# Patient Record
Sex: Male | Born: 1970 | Race: Black or African American | Hispanic: No | Marital: Married | Smoking: Former smoker
Health system: Southern US, Community
[De-identification: ages and names within clinical notes are randomized; demographics above are authoritative.]

## PROBLEM LIST (undated history)

## (undated) DIAGNOSIS — R7303 Prediabetes: Secondary | ICD-10-CM

## (undated) DIAGNOSIS — E785 Hyperlipidemia, unspecified: Secondary | ICD-10-CM

## (undated) DIAGNOSIS — R011 Cardiac murmur, unspecified: Secondary | ICD-10-CM

## (undated) DIAGNOSIS — I10 Essential (primary) hypertension: Secondary | ICD-10-CM

## (undated) DIAGNOSIS — K219 Gastro-esophageal reflux disease without esophagitis: Secondary | ICD-10-CM

## (undated) HISTORY — DX: Hyperlipidemia, unspecified: E78.5

## (undated) HISTORY — DX: Prediabetes: R73.03

## (undated) HISTORY — DX: Cardiac murmur, unspecified: R01.1

## (undated) HISTORY — DX: Gastro-esophageal reflux disease without esophagitis: K21.9

## (undated) HISTORY — DX: Essential (primary) hypertension: I10

---

## 2019-05-25 LAB — HM HIV SCREENING LAB: HM HIV Screening: NEGATIVE

## 2019-05-25 LAB — HM HEPATITIS C SCREENING LAB: HM Hepatitis Screen: NEGATIVE

## 2020-10-05 DIAGNOSIS — S92309A Fracture of unspecified metatarsal bone(s), unspecified foot, initial encounter for closed fracture: Secondary | ICD-10-CM | POA: Insufficient documentation

## 2020-10-05 DIAGNOSIS — S9782XA Crushing injury of left foot, initial encounter: Secondary | ICD-10-CM | POA: Insufficient documentation

## 2021-04-03 ENCOUNTER — Emergency Department (HOSPITAL_COMMUNITY)
Admission: EM | Admit: 2021-04-03 | Discharge: 2021-04-04 | Disposition: A | Discharge status: Home / Self Care | Payer: No Typology Code available for payment source | Attending: Emergency Medicine | Admitting: Emergency Medicine

## 2021-04-03 ENCOUNTER — Emergency Department (HOSPITAL_COMMUNITY): Payer: No Typology Code available for payment source

## 2021-04-03 DIAGNOSIS — Y990 Civilian activity done for income or pay: Secondary | ICD-10-CM | POA: Insufficient documentation

## 2021-04-03 DIAGNOSIS — S0990XA Unspecified injury of head, initial encounter: Secondary | ICD-10-CM

## 2021-04-03 DIAGNOSIS — W01198A Fall on same level from slipping, tripping and stumbling with subsequent striking against other object, initial encounter: Secondary | ICD-10-CM | POA: Diagnosis not present

## 2021-04-03 DIAGNOSIS — G44319 Acute post-traumatic headache, not intractable: Secondary | ICD-10-CM

## 2021-04-03 DIAGNOSIS — R55 Syncope and collapse: Secondary | ICD-10-CM | POA: Insufficient documentation

## 2021-04-03 DIAGNOSIS — R4020 Unspecified coma: Secondary | ICD-10-CM

## 2021-04-03 NOTE — ED Provider Triage Note (Signed)
Emergency Medicine Provider Triage Evaluation Note ? ?Gary Bernard , a 51 y.o. male  was evaluated in triage.  Pt complains of mechanical fall at work. 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 + LOC. He felt fine after he woke up. No n/v, dizziness, vision change. No abrasion, hematoma, or laceration to head. He does have associated neck pain.  ? ?Review of Systems  ?Positive:  ?Negative:  ? ?Physical Exam  ?BP (!) 172/101 (BP Location: Right Arm)   Pulse 71   Temp 98.4 ?F (36.9 ?C) (Oral)   Resp 18   SpO2 98%  ?Gen:   Awake, no distress   ?Resp:  Normal effort  ?MSK:   Moves extremities without difficulty  ?Other:  PERRLA. EOM intact. Moving all ext. Speech normal. ? ?Medical Decision Making  ?Medically screening exam initiated at 9:32 PM.  Appropriate orders placed.  Pasqual Farias was informed that the remainder of the evaluation will be completed by another provider, this initial triage assessment does not replace that evaluation, and the importance of remaining in the ED until their evaluation is complete. ? ? ?  ?Adolphus Birchwood, PA-C ?04/03/21 2133 ? ?

## 2021-04-03 NOTE — ED Triage Notes (Signed)
Pt c/o HA/dizziness, neck pain after mechanical fall, hit head on forklift. Pt +LOC, -cut/abrasion to head. PERRL ?

## 2021-04-04 NOTE — ED Provider Notes (Signed)
?Gary Bernard ?Provider Note ? ? ?CSN: [id number] ?Arrival date & time: 04/03/21  2112 ? ?  ? ?History ? ?Chief Complaint  ?Patient presents with  ? Fall  ? ? ?Gary Bernard is a 51 y.o. male. ? ?Patient presents to the emergency department for evaluation of head injury.  Patient was at work when he struck his head on a forklift.  He had a loss of consciousness.  Subsequently had a headache, right posterior and right frontal.  He is not sure how long he was knocked out for.  No subsequent vomiting, confusion, HA.  No weakness, numbness, tingling in extremities.  ? ? ?  ? ?Home Medications ?Prior to Admission medications   ?Not on File  ?   ? ?Allergies    ?Patient has no allergy information on record.   ? ?Review of Systems   ?Review of Systems ? ?Physical Exam ?Updated Vital Signs ?BP (!) 165/105 (BP Location: Right Arm)   Pulse 70   Temp 98.4 ?F (36.9 ?C) (Oral)   Resp 19   SpO2 98%  ? ?Physical Exam ?Vitals and nursing note reviewed.  ?Constitutional:   ?   Appearance: He is well-developed.  ?HENT:  ?   Head: Normocephalic. No raccoon eyes or Battle's sign.  ?   Comments: Minimal hematoma, right frontal forehead, no ecchymosis no depressions ?   Right Ear: Tympanic membrane, ear canal and external ear normal. No hemotympanum.  ?   Left Ear: Tympanic membrane, ear canal and external ear normal. No hemotympanum.  ?   Nose: Nose normal.  ?Eyes:  ?   General: Lids are normal.  ?   Conjunctiva/sclera: Conjunctivae normal.  ?   Pupils: Pupils are equal, round, and reactive to light.  ?   Comments: No visible hyphema  ?Cardiovascular:  ?   Rate and Rhythm: Normal rate and regular rhythm.  ?Pulmonary:  ?   Effort: Pulmonary effort is normal.  ?   Breath sounds: Normal breath sounds.  ?Abdominal:  ?   Palpations: Abdomen is soft.  ?   Tenderness: There is no abdominal tenderness.  ?Musculoskeletal:     ?   General: Normal range of motion.  ?   Cervical back: Normal range of motion  and neck supple. Tenderness present. No bony tenderness. Normal range of motion.  ?   Thoracic back: No tenderness or bony tenderness.  ?   Lumbar back: No tenderness or bony tenderness.  ?Skin: ?   General: Skin is warm and dry.  ?Neurological:  ?   Mental Status: He is alert and oriented to person, place, and time.  ?   GCS: GCS eye subscore is 4. GCS verbal subscore is 5. GCS motor subscore is 6.  ?   Cranial Nerves: No cranial nerve deficit.  ?   Sensory: No sensory deficit.  ?   Coordination: Coordination normal.  ?   Deep Tendon Reflexes: Reflexes are normal and symmetric.  ? ? ?ED Results / Procedures / Treatments   ?Labs ?(all labs ordered are listed, but only abnormal results are displayed) ?Labs Reviewed - No data to display ? ?EKG ?None ? ?Radiology ?CT Head Wo Contrast ? ?Result Date: 04/04/2021 ?CLINICAL DATA:  Recent fall with head and neck pain, initial encounter EXAM: CT HEAD WITHOUT CONTRAST CT CERVICAL SPINE WITHOUT CONTRAST TECHNIQUE: Multidetector CT imaging of the head and cervical spine was performed following the standard protocol without intravenous contrast. Multiplanar CT image reconstructions of  the cervical spine were also generated. RADIATION DOSE REDUCTION: This exam was performed according to the departmental dose-optimization program which includes automated exposure control, adjustment of the mA and/or kV according to patient size and/or use of iterative reconstruction technique. COMPARISON:  None. FINDINGS: CT HEAD FINDINGS Brain: No evidence of acute infarction, hemorrhage, hydrocephalus, extra-axial collection or mass lesion/mass effect. Vascular: No hyperdense vessel or unexpected calcification. Skull: Normal. Negative for fracture or focal lesion. Sinuses/Orbits: No acute finding. Other: None. CT CERVICAL SPINE FINDINGS Alignment: Within normal limits. Skull base and vertebrae: 7 cervical segments are well visualized. Vertebral body height is well maintained. No acute fracture or  acute facet abnormality is noted. The odontoid is within normal limits. Soft tissues and spinal canal: Surrounding soft tissue structures are within normal limits. Upper chest: Visualized lung apices are unremarkable. Other: None IMPRESSION: CT of the head: No acute intracranial abnormality noted. CT of the cervical spine: No acute abnormality noted. Electronically Signed   By: Inez Catalina M.D.   On: 04/04/2021 00:00  ? ?CT Cervical Spine Wo Contrast ? ?Result Date: 04/04/2021 ?CLINICAL DATA:  Recent fall with head and neck pain, initial encounter EXAM: CT HEAD WITHOUT CONTRAST CT CERVICAL SPINE WITHOUT CONTRAST TECHNIQUE: Multidetector CT imaging of the head and cervical spine was performed following the standard protocol without intravenous contrast. Multiplanar CT image reconstructions of the cervical spine were also generated. RADIATION DOSE REDUCTION: This exam was performed according to the departmental dose-optimization program which includes automated exposure control, adjustment of the mA and/or kV according to patient size and/or use of iterative reconstruction technique. COMPARISON:  None. FINDINGS: CT HEAD FINDINGS Brain: No evidence of acute infarction, hemorrhage, hydrocephalus, extra-axial collection or mass lesion/mass effect. Vascular: No hyperdense vessel or unexpected calcification. Skull: Normal. Negative for fracture or focal lesion. Sinuses/Orbits: No acute finding. Other: None. CT CERVICAL SPINE FINDINGS Alignment: Within normal limits. Skull base and vertebrae: 7 cervical segments are well visualized. Vertebral body height is well maintained. No acute fracture or acute facet abnormality is noted. The odontoid is within normal limits. Soft tissues and spinal canal: Surrounding soft tissue structures are within normal limits. Upper chest: Visualized lung apices are unremarkable. Other: None IMPRESSION: CT of the head: No acute intracranial abnormality noted. CT of the cervical spine: No acute  abnormality noted. Electronically Signed   By: Inez Catalina M.D.   On: 04/04/2021 00:00   ? ?Procedures ?Procedures  ? ? ?Medications Ordered in ED ?Medications - No data to display ? ?ED Course/ Medical Decision Making/ A&P ?  ? ?Patient seen and examined. History obtained directly from patient. Work-up including labs, imaging, EKG ordered in triage, if performed, were reviewed.   ? ?Imaging: Independently reviewed and interpreted.  This included: CT head and cervical spine --agree no obvious fractures, intracranial bleeding. ? ?Most recent vital signs reviewed and are as follows: ?BP (!) 165/105 (BP Location: Right Arm)   Pulse 70   Temp 98.4 ?F (36.9 ?C) (Oral)   Resp 19   SpO2 98%  ? ?Initial impression: Minor head injury, with loss of consciousness, reassuring imaging ? ?Home treatment plan: Use of Tylenol, Motrin for headache and pain.  We discussed signs and symptoms of concussion and need to avoid activities which make symptoms worse.  Patient given a note for work and referral to the concussion clinic.  He does not currently have a PCP. ? ?Wife mentioned blood pressure being elevated.  Patient is not on medications for this and has not  seen a doctor in several years.  Encourage patient to monitor blood pressures at home and to follow-up with PCP. ? ?Return instructions discussed with patient: Patient was counseled on head injury precautions and symptoms that should indicate their return to the ED.  These include severe worsening headache, vision changes, confusion, loss of consciousness, trouble walking, nausea & vomiting, or weakness/tingling in extremities.   ? ?Follow-up instructions discussed with patient: Follow-up with PCP or concussion clinic in 1 week if symptoms are persistent. ? ? ? ?                        ?Medical Decision Making ? ?Patient with minor head injury.  He did have a loss of consciousness.  Fortunately imaging negative.  Patient waiting in the emergency department for several  hours without signs of decompensation.  No concern for occult intracranial bleeding, occult cervical fracture. ? ? ? ? ? ? ? ?Final Clinical Impression(s) / ED Diagnoses ?Final diagnoses:  ?Injury of head, initial

## 2021-04-04 NOTE — Discharge Instructions (Signed)
Please read and follow all provided instructions. ? ?Your diagnoses today include:  ?1. Injury of head, initial encounter   ?2. Loss of consciousness ([employer])   ?3. Acute post-traumatic headache, not intractable   ? ? ?Tests performed today include: ?CT scan of your head and neck that did not show any serious injury. ?Vital signs. See below for your results today.  ? ?Medications prescribed:  ?Please use over-the-counter NSAID medications (ibuprofen, naproxen) as directed on the packaging for pain if you do not have any reasons not to take these medications just as weak kidneys or a history of bleeding in your stomach or gut.  ? ?Take any prescribed medications only as directed. ? ?Home care instructions:  ?Follow any educational materials contained in this packet. ? ?BE VERY CAREFUL not to take multiple medicines containing Tylenol (also called acetaminophen). Doing so can lead to an overdose which can damage your liver and cause liver failure and possibly death.  ? ?Follow-up instructions: ?Please follow-up with your primary care provider in the next 5-7 days for further evaluation of your symptoms.  If you do not have a primary care doctor, I have provided a referral to the [facility] to follow-up if you continue to have symptoms. ? ?Return instructions:  ?SEEK IMMEDIATE MEDICAL ATTENTION IF: ?There is confusion or drowsiness (although children frequently become drowsy after injury).  ?You cannot awaken the injured person.  ?You have more than one episode of vomiting.  ?You notice dizziness or unsteadiness which is getting worse, or inability to walk.  ?You have convulsions or unconsciousness.  ?You experience severe, persistent headaches not relieved by Tylenol. ?You cannot use arms or legs normally.  ?There are changes in pupil sizes. (This is the black center in the colored part of the eye)  ?There is clear or bloody discharge from the nose or ears.  ?You have change in speech, vision, swallowing,  or understanding.  ?Localized weakness, numbness, tingling, or change in bowel or bladder control. ?You have any other emergent concerns. ? ?Additional Information: ?You have had a head injury which does not appear to require admission at this time. ? ?Your vital signs today were: ?BP (!) 172/101 (BP Location: Right Arm)   Pulse 71   Temp 98.4 ?F (36.9 ?C) (Oral)   Resp 18   SpO2 98%  ?If your blood pressure (BP) was elevated above 135/85 this visit, please have this repeated by your doctor within one month. ?-------------- ? ?

## 2021-04-04 NOTE — ED Notes (Signed)
RN reviewed discharge instructions with pt. Pt verbalized understanding and had no further questions. VSS upon discharge.  

## 2021-04-23 NOTE — Progress Notes (Signed)
? ?New Patient Office Visit ? ?Subjective   ? ?Patient ID: Gary Bernard, male    DOB: [birth year]  Age: 51 y.o. MRN: [id number] ? ?CC:  ?Chief Complaint  ?Patient presents with  ? Establish Care  ?  Np. Est care. Pt c/o tingling/numbness in rt hand and rt leg for several months. Hx of high BP.   ? ? ?HPI ?Gary Bernard presents for new patient visit to establish care.  Introduced to Designer, [employer]ellery role and practice setting.  All questions answered.  Discussed provider/patient relationship and expectations. ? ?He has a history of hypertension since he was in his 67s. He does have a blood pressure machine but stopped checking it when his blood pressure was elevated when he ran out of medication. He was nifedipine 106m daily until he ran out. He does have some shortness of breath with exertion which is new for him over the past few months. He denies chest pain and headaches.  ? ?He endorses numbness and tingling in his right arm and hand. It sometimes happens intermittently in his right leg. He thought maybe he was sleeping on it, but has been sleeping on his left side. He states that his lower back and neck have been stiff off an on. He states that he fell recently at work about 2-3 weeks ago and it has flared up since then. He is going to PT.  ? ?Depression screen done: ? ? ?  04/24/2021  ?  1:30 PM  ?Depression screen PHQ 2/9  ?Decreased Interest 0  ?Down, Depressed, Hopeless 0  ?PHQ - 2 Score 0  ? ? ?Outpatient Encounter Medications as of 04/24/2021  ?Medication Sig  ? aspirin EC 81 MG tablet Take 81 mg by mouth daily. Swallow whole.  ? [DISCONTINUED] NIFEdipine (ADALAT CC) 30 MG 24 hr tablet Take 30 mg by mouth daily.  ? [DISCONTINUED] NIFEdipine (PROCARDIA-XL/NIFEDICAL-XL) 30 MG 24 hr tablet Take 30 mg by mouth daily.  ? ibuprofen (ADVIL) 800 MG tablet Take 800 mg by mouth 3 (three) times daily as needed.  ? NIFEdipine (PROCARDIA-XL/NIFEDICAL-XL) 30 MG 24 hr tablet Take 1 tablet (30 mg total) by mouth daily.  ?  [DISCONTINUED] diclofenac Sodium (VOLTAREN) 1 % GEL diclofenac 1 % topical gel ? APPLY 2 GRAMS TO THE AFFECTED AREA(S) BY TOPICAL ROUTE 4 TIMES PER DAY  ? [DISCONTINUED] meloxicam (MOBIC) 7.5 MG tablet meloxicam 7.5 mg tablet ? Take 1 tablet PO twice a day for 2 weeks and then PRN  ? ?No facility-administered encounter medications on file as of 04/24/2021.  ? ? ?Past Medical History:  ?Diagnosis Date  ? Heart murmur   ? Hypertension   ? ? ?History reviewed. No pertinent surgical history. ? ?Family History  ?Problem Relation Age of Onset  ? Hypertension Mother   ? Hypertension Father   ? ? ?Social History  ? ?Socioeconomic History  ? Marital status: Married  ?  Spouse name: Not on file  ? Number of children: Not on file  ? Years of education: Not on file  ? Highest education level: Not on file  ?Occupational History  ? Not on file  ?Tobacco Use  ? Smoking status: Never  ? Smokeless tobacco: Never  ?Vaping Use  ? Vaping Use: Never used  ?Substance and Sexual Activity  ? Alcohol use: Yes  ?  Comment: occasionally  ? Drug use: Never  ? Sexual activity: Not on file  ?Other Topics Concern  ? Not on file  ?Social History Narrative  ?  Not on file  ? ?Social Determinants of Health  ? ?Financial Resource Strain: Not on file  ?Food Insecurity: Not on file  ?Transportation Needs: Not on file  ?Physical Activity: Not on file  ?Stress: Not on file  ?Social Connections: Not on file  ?Intimate Partner Violence: Not on file  ? ? ?Review of Systems  ?Constitutional: Negative.   ?HENT: Negative.    ?Eyes: Negative.   ?Respiratory:  Positive for shortness of breath (with exertion).   ?Cardiovascular: Negative.   ?Gastrointestinal: Negative.   ?Genitourinary: Negative.   ?Musculoskeletal:  Positive for back pain (intermittent).  ?Skin: Negative.   ?Neurological: Negative.   ?Psychiatric/Behavioral: Negative.    ? ?  ?Objective   ? ?BP (!) 168/110 (BP Location: Right Arm, Cuff Size: Large)   Pulse 76   Temp 97.8 ?F (36.6 ?C) (Temporal)    Ht 5' 6.75" (1.695 m)   Wt 212 lb 9.6 oz (96.4 kg)   SpO2 97%   BMI 33.55 kg/m?  ? ?Physical Exam ?Vitals and nursing note reviewed.  ?Constitutional:   ?   Appearance: Normal appearance.  ?HENT:  ?   Head: Normocephalic and atraumatic.  ?   Right Ear: Tympanic membrane, ear canal and external ear normal.  ?   Left Ear: Tympanic membrane, ear canal and external ear normal.  ?   Nose: Nose normal.  ?   Mouth/Throat:  ?   Mouth: Mucous membranes are moist.  ?   Pharynx: Oropharynx is clear.  ?Eyes:  ?   Conjunctiva/sclera: Conjunctivae normal.  ?Cardiovascular:  ?   Rate and Rhythm: Normal rate and regular rhythm.  ?   Pulses: Normal pulses.  ?   Heart sounds: Normal heart sounds.  ?Pulmonary:  ?   Effort: Pulmonary effort is normal.  ?   Breath sounds: Normal breath sounds.  ?Abdominal:  ?   Palpations: Abdomen is soft.  ?   Tenderness: There is no abdominal tenderness.  ?Musculoskeletal:     ?   General: No tenderness. Normal range of motion.  ?   Cervical back: Normal range of motion and neck supple.  ?   Right lower leg: No edema.  ?   Left lower leg: No edema.  ?   Comments: Strength 5/5 in bilateral upper and lower extremities   ?Lymphadenopathy:  ?   Cervical: No cervical adenopathy.  ?Skin: ?   General: Skin is warm and dry.  ?Neurological:  ?   General: No focal deficit present.  ?   Mental Status: He is alert and oriented to person, place, and time.  ?   Cranial Nerves: No cranial nerve deficit.  ?   Motor: No weakness.  ?   Coordination: Coordination normal.  ?   Gait: Gait normal.  ?Psychiatric:     ?   Mood and Affect: Mood normal.     ?   Behavior: Behavior normal.     ?   Thought Content: Thought content normal.     ?   Judgment: Judgment normal.  ? ? ? ?Assessment & Plan:  ? ?Problem List Items Addressed This Visit   ? ?  ? Cardiovascular and Mediastinum  ? Primary hypertension - Primary  ?  Chronic, not controlled. BP 168/110 today on recheck. He has been out of his medication for the last year  and he had gotten on 90 day supply of medication that he was taking over 2 years. Will re-start nifedipine XR 83m daily. Check  CMP, CBC, and lipid panel. Discussed limiting salt in his diet. Follow up in 3-4 weeks.  ? ?  ?  ? Relevant Medications  ? aspirin EC 81 MG tablet  ? NIFEdipine (PROCARDIA-XL/NIFEDICAL-XL) 30 MG 24 hr tablet  ? Other Relevant Orders  ? CBC with Differential/Platelet  ? Comprehensive metabolic panel  ?  ? Other  ? Numbness and tingling  ?  He has been having intermittent numbness and tingling down his right arm and hand. It also sometimes happens in his right leg. He states that he has had 2 recent falls at work and is currently in PT. He states that this has happened intermittently before these injuries. Will check CMP, CBC, A1C, and vitamin B12. Continue with PT. Follow-up in 3-4 weeks.  ? ?  ?  ? Relevant Orders  ? Hemoglobin A1c  ? TSH  ? Vitamin B12  ? Shortness of breath on exertion  ?  He states that for the past few months he has been having more shortness of breath with exertion than when he was younger. He is not sure if this is just aging or if something is going on. His blood pressure is not controlled today, re-starting nifedipine XR 30 mg daily. Check CMP, CBC. EKG shows normal sinus rhythm heart rate 64. QRS complexes tall, possible LVH. Educated that it is important to keep blood pressure controlled. Follow-up in 3-4 weeks.  ? ?  ?  ? Relevant Orders  ? EKG 12-Lead (Completed)  ? Snoring  ?  His wife states that he snores at night and has periods where he will stop breathing and gasp for breath after. This has been ongoing for at least 10 years. Will order a home sleep study for further evaluation.  ? ?  ?  ? Relevant Orders  ? Ambulatory referral to Sleep Studies  ? ?Other Visit Diagnoses   ? ? History of vitamin D deficiency      ? Check vitamin D today and treat based on results.   ? Relevant Orders  ? VITAMIN D 25 Hydroxy (Vit-D Deficiency, Fractures)  ? Encounter for  lipid screening for cardiovascular disease      ? Screen lipid panel today  ? Relevant Orders  ? Lipid panel  ? Screen for colon cancer      ? Options for colon cancer screening discussed and he would like to d

## 2021-04-24 ENCOUNTER — Encounter: Payer: Self-pay | Admitting: Nurse Practitioner

## 2021-04-24 ENCOUNTER — Ambulatory Visit: Payer: Managed Care, Other (non HMO) | Admitting: Nurse Practitioner

## 2021-04-24 VITALS — BP 168/110 | HR 76 | Temp 97.8°F | Ht 66.75 in | Wt 212.6 lb

## 2021-04-24 DIAGNOSIS — I10 Essential (primary) hypertension: Secondary | ICD-10-CM

## 2021-04-24 DIAGNOSIS — Z136 Encounter for screening for cardiovascular disorders: Secondary | ICD-10-CM

## 2021-04-24 DIAGNOSIS — Z8639 Personal history of other endocrine, nutritional and metabolic disease: Secondary | ICD-10-CM

## 2021-04-24 DIAGNOSIS — R0602 Shortness of breath: Secondary | ICD-10-CM

## 2021-04-24 DIAGNOSIS — R200 Anesthesia of skin: Secondary | ICD-10-CM

## 2021-04-24 DIAGNOSIS — Z1211 Encounter for screening for malignant neoplasm of colon: Secondary | ICD-10-CM

## 2021-04-24 DIAGNOSIS — R0683 Snoring: Secondary | ICD-10-CM

## 2021-04-24 DIAGNOSIS — Z13220 Encounter for screening for lipoid disorders: Secondary | ICD-10-CM

## 2021-04-24 DIAGNOSIS — Z23 Encounter for immunization: Secondary | ICD-10-CM | POA: Diagnosis not present

## 2021-04-24 DIAGNOSIS — R202 Paresthesia of skin: Secondary | ICD-10-CM | POA: Diagnosis not present

## 2021-04-24 LAB — CBC WITH DIFFERENTIAL/PLATELET
Basophils Absolute: 0 10*3/uL (ref 0.0–0.1)
Basophils Relative: 0.4 % (ref 0.0–3.0)
Eosinophils Absolute: 0.2 10*3/uL (ref 0.0–0.7)
Eosinophils Relative: 3.6 % (ref 0.0–5.0)
HCT: 40.9 % (ref 39.0–52.0)
Hemoglobin: 13.5 g/dL (ref 13.0–17.0)
Lymphocytes Relative: 41.5 % (ref 12.0–46.0)
Lymphs Abs: 1.9 10*3/uL (ref 0.7–4.0)
MCHC: 33 g/dL (ref 30.0–36.0)
MCV: 86.7 fl (ref 78.0–100.0)
Monocytes Absolute: 0.3 10*3/uL (ref 0.1–1.0)
Monocytes Relative: 5.9 % (ref 3.0–12.0)
Neutro Abs: 2.2 10*3/uL (ref 1.4–7.7)
Neutrophils Relative %: 48.6 % (ref 43.0–77.0)
Platelets: 306 10*3/uL (ref 150.0–400.0)
RBC: 4.72 Mil/uL (ref 4.22–5.81)
RDW: 13.5 % (ref 11.5–15.5)
WBC: 4.5 10*3/uL (ref 4.0–10.5)

## 2021-04-24 LAB — COMPREHENSIVE METABOLIC PANEL
ALT: 36 U/L (ref 0–53)
AST: 28 U/L (ref 0–37)
Albumin: 4.2 g/dL (ref 3.5–5.2)
Alkaline Phosphatase: 66 U/L (ref 39–117)
BUN: 11 mg/dL (ref 6–23)
CO2: 27 mEq/L (ref 19–32)
Calcium: 9.3 mg/dL (ref 8.4–10.5)
Chloride: 105 mEq/L (ref 96–112)
Creatinine, Ser: 0.87 mg/dL (ref 0.40–1.50)
GFR: 101.41 mL/min (ref >60.00)
Glucose, Bld: 103 mg/dL — ABNORMAL HIGH (ref 70–99)
Potassium: 3.7 mEq/L (ref 3.5–5.1)
Sodium: 139 mEq/L (ref 135–145)
Total Bilirubin: 0.3 mg/dL (ref 0.2–1.2)
Total Protein: 7.4 g/dL (ref 6.0–8.3)

## 2021-04-24 LAB — LIPID PANEL
Cholesterol: 205 mg/dL — ABNORMAL HIGH (ref 0–200)
HDL: 45.3 mg/dL (ref >39.00)
LDL Cholesterol: 140 mg/dL — ABNORMAL HIGH (ref 0–99)
NonHDL: 159.35
Total CHOL/HDL Ratio: 5
Triglycerides: 97 mg/dL (ref 0.0–149.0)
VLDL: 19.4 mg/dL (ref 0.0–40.0)

## 2021-04-24 LAB — VITAMIN D 25 HYDROXY (VIT D DEFICIENCY, FRACTURES): VITD: 14.03 ng/mL — ABNORMAL LOW (ref 30.00–100.00)

## 2021-04-24 LAB — HEMOGLOBIN A1C: Hgb A1c MFr Bld: 6.5 % (ref 4.6–6.5)

## 2021-04-24 LAB — TSH: TSH: 1.06 u[IU]/mL (ref 0.35–5.50)

## 2021-04-24 LAB — VITAMIN B12: Vitamin B-12: 414 pg/mL (ref 211–911)

## 2021-04-24 MED ORDER — NIFEDIPINE ER OSMOTIC RELEASE 30 MG PO TB24: 30.0000 mg | ORAL_TABLET | Freq: Every day | ORAL | 0 refills | Status: DC

## 2021-04-24 NOTE — Progress Notes (Signed)
EKG interpreted by me on 04/24/21 showed normal sinus rhythm, heart rate 64. QRS complexes tall, consider LVH ? ?

## 2021-04-24 NOTE — Assessment & Plan Note (Addendum)
He states that for the past few months he has been having more shortness of breath with exertion than when he was younger. He is not sure if this is just aging or if something is going on. His blood pressure is not controlled today, re-starting nifedipine XR 30 mg daily. Check CMP, CBC. EKG shows normal sinus rhythm heart rate 64. QRS complexes tall, possible LVH. Educated that it is important to keep blood pressure controlled. Follow-up in 3-4 weeks.  ?

## 2021-04-24 NOTE — Assessment & Plan Note (Signed)
Chronic, not controlled. BP 168/110 today on recheck. He has been out of his medication for the last year and he had gotten on 90 day supply of medication that he was taking over 2 years. Will re-start nifedipine XR 21m daily. Check CMP, CBC, and lipid panel. Discussed limiting salt in his diet. Follow up in 3-4 weeks.  ?

## 2021-04-24 NOTE — Patient Instructions (Signed)
It was great to see you! ? ?We are checking your labs today and will let you know the results via mychart/phone.  ? ?I have refilled your nifedipine 65m to take daily. Re-start checking your blood pressure daily at home. Try to limit the amount of salt you are eating.  ? ?Let's follow-up in 3-4 weeks, sooner if you have concerns. ? ?If a referral was placed today, you will be contacted for an appointment. Please note that routine referrals can sometimes take up to 3-4 weeks to process. Please call our office if you haven't heard anything after this time frame. ? ?Take care, ? ?LVance Peper NP ? ?

## 2021-04-24 NOTE — Assessment & Plan Note (Signed)
He has been having intermittent numbness and tingling down his right arm and hand. It also sometimes happens in his right leg. He states that he has had 2 recent falls at work and is currently in PT. He states that this has happened intermittently before these injuries. Will check CMP, CBC, A1C, and vitamin B12. Continue with PT. Follow-up in 3-4 weeks.  ?

## 2021-04-24 NOTE — Assessment & Plan Note (Signed)
His wife states that he snores at night and has periods where he will stop breathing and gasp for breath after. This has been ongoing for at least 10 years. Will order a home sleep study for further evaluation.  ?

## 2021-04-25 NOTE — Progress Notes (Signed)
Called and informed patient's spouse (DPR) of results and provider instructions. Patient spouse voiced understanding.

## 2021-05-20 LAB — COLOGUARD: COLOGUARD: NEGATIVE

## 2021-05-21 ENCOUNTER — Institutional Professional Consult (permissible substitution): Payer: Managed Care, Other (non HMO) | Admitting: Neurology

## 2021-05-23 NOTE — Progress Notes (Unsigned)
Established Patient Office Visit  Subjective   Patient ID: Gary Bernard, male    DOB: Mar 26, 1970  Age: 51 y.o. MRN: [id number]  Chief Complaint  Patient presents with   Hypertension    No concerns. Not fasting    HPI  Gary Bernard is here today to follow-up on hypertension. He was re-started on his nifedipine 69m daily. He has been checking his blood pressures at home 150s/80s. He has lost 5 pounds since his last visit. He has been watching his diet, eating more salads and grilled chicken. He denies chest pain, shortness of breath, and headaches.   He states that he has been noticing a ringing and slight pain in his right ear for the past few days. He denies fevers, sore throat, and nasal congestion. He has not tried anything for this pain.   Past Medical History:  Diagnosis Date   Heart murmur    Hypertension    History reviewed. No pertinent surgical history.  ROS See pertinent positives and negatives per HPI.   Objective:     BP (!) 142/92 (BP Location: Right Arm, Cuff Size: Large)   Pulse 77   Temp (!) 97.1 F (36.2 C) (Temporal)   Ht 5' 7"  (1.702 m)   Wt 207 lb 3.2 oz (94 kg)   SpO2 97%   BMI 32.45 kg/m  BP Readings from Last 3 Encounters:  05/24/21 (!) 142/92  04/24/21 (!) 168/110  04/04/21 (!) 165/105   Physical Exam Vitals and nursing note reviewed.  Constitutional:      Appearance: Normal appearance.  HENT:     Head: Normocephalic.     Right Ear: External ear normal. There is impacted cerumen.     Left Ear: Tympanic membrane, ear canal and external ear normal.  Eyes:     Conjunctiva/sclera: Conjunctivae normal.  Cardiovascular:     Rate and Rhythm: Normal rate and regular rhythm.     Pulses: Normal pulses.     Heart sounds: Normal heart sounds.  Pulmonary:     Effort: Pulmonary effort is normal.     Breath sounds: Normal breath sounds.  Musculoskeletal:     Cervical back: Normal range of motion.  Skin:    General: Skin is warm.  Neurological:      General: No focal deficit present.     Mental Status: He is alert and oriented to person, place, and time.  Psychiatric:        Mood and Affect: Mood normal.        Behavior: Behavior normal.        Thought Content: Thought content normal.        Judgment: Judgment normal.   Results for orders placed or performed in visit on 05/24/21  HM HIV SCREENING LAB  Result Value Ref Range   HM HIV Screening Negative - Patient reported   HM HEPATITIS C SCREENING LAB  Result Value Ref Range   HM Hepatitis Screen Negative - Patient Reported   POCT glycosylated hemoglobin (Hb A1C)  Result Value Ref Range   Hemoglobin A1C 6.1 (A) 4.0 - 5.6 %   HbA1c POC (<> result, manual entry) 6.1 4.0 - 5.6 %   HbA1c, POC (prediabetic range) 6.1 5.7 - 6.4 %   HbA1c, POC (controlled diabetic range) 6.1 0.0 - 7.0 %    Last CBC Lab Results  Component Value Date   WBC 4.5 04/24/2021   HGB 13.5 04/24/2021   HCT 40.9 04/24/2021   MCV 86.7 04/24/2021  RDW 13.5 04/24/2021   PLT 306.0 26/33/3545   Last metabolic panel Lab Results  Component Value Date   GLUCOSE 103 (H) 04/24/2021   NA 139 04/24/2021   K 3.7 04/24/2021   CL 105 04/24/2021   CO2 27 04/24/2021   BUN 11 04/24/2021   CREATININE 0.87 04/24/2021   CALCIUM 9.3 04/24/2021   PROT 7.4 04/24/2021   ALBUMIN 4.2 04/24/2021   BILITOT 0.3 04/24/2021   ALKPHOS 66 04/24/2021   AST 28 04/24/2021   ALT 36 04/24/2021   Last lipids Lab Results  Component Value Date   CHOL 205 (H) 04/24/2021   HDL 45.30 04/24/2021   LDLCALC 140 (H) 04/24/2021   TRIG 97.0 04/24/2021   CHOLHDL 5 04/24/2021   Last hemoglobin A1c Lab Results  Component Value Date   HGBA1C 6.1 (A) 05/24/2021   HGBA1C 6.1 05/24/2021   HGBA1C 6.1 05/24/2021   HGBA1C 6.1 05/24/2021   Last vitamin D Lab Results  Component Value Date   VD25OH 14.03 (L) 04/24/2021   Last vitamin B12 and Folate Lab Results  Component Value Date   [id number] 04/24/2021   The 10-year ASCVD  risk score (Arnett DK, et al., 2019) is: 10.5%    Assessment & Plan:   Problem List Items Addressed This Visit       Cardiovascular and Mediastinum   Primary hypertension    Chronic, improving. His BP today was 142/92 and his blood pressures at home have been 150s/80s. With blood pressure still not at goal, will increase nifedipine to 91m daily. Continue checking blood pressure at home. Follow-up in 4 weeks.        Relevant Medications   rosuvastatin (CRESTOR) 20 MG tablet   NIFEdipine (PROCARDIA XL/NIFEDICAL XL) 60 MG 24 hr tablet     Other   Elevated hemoglobin A1c - Primary    Last A1c was 6.5%, however his glucose was only 103. Repeat POC A1c today was 6.1% consistent with prediabetes. Discussed diet and exercise, specifically limiting carbohydrates and added sugars.        Relevant Orders   POCT glycosylated hemoglobin (Hb A1C) (Completed)   Hyperlipidemia    ASCVD score 10.5%. Discussed the benefits of starting a statin and possible side effects. He would like to proceed. Will start rosuvastatin 241mdaily. Also discussed continuing diet and exercise. Follow-up 4 weeks.        Relevant Medications   rosuvastatin (CRESTOR) 20 MG tablet   NIFEdipine (PROCARDIA XL/NIFEDICAL XL) 60 MG 24 hr tablet   Other Visit Diagnoses     Impacted cerumen of right ear       Right ear with pain, ringing, and impaction. Discussed irrigation and pt. gave verbal consent. Right ear irrigated with good results and tolerated well.        Return in about 4 weeks (around 06/21/2021) for 4-6 weeks , HLD, HTN.    LaCharyl DancerNP

## 2021-05-24 ENCOUNTER — Ambulatory Visit: Payer: Managed Care, Other (non HMO) | Admitting: Nurse Practitioner

## 2021-05-24 ENCOUNTER — Encounter: Payer: Self-pay | Admitting: Nurse Practitioner

## 2021-05-24 VITALS — BP 142/92 | HR 77 | Temp 97.1°F | Ht 67.0 in | Wt 207.2 lb

## 2021-05-24 DIAGNOSIS — H6121 Impacted cerumen, right ear: Secondary | ICD-10-CM | POA: Diagnosis not present

## 2021-05-24 DIAGNOSIS — R7309 Other abnormal glucose: Secondary | ICD-10-CM

## 2021-05-24 DIAGNOSIS — E785 Hyperlipidemia, unspecified: Secondary | ICD-10-CM | POA: Diagnosis not present

## 2021-05-24 DIAGNOSIS — I10 Essential (primary) hypertension: Secondary | ICD-10-CM | POA: Diagnosis not present

## 2021-05-24 LAB — POCT GLYCOSYLATED HEMOGLOBIN (HGB A1C)
HbA1c POC (<> result, manual entry): 6.1 % (ref 4.0–5.6)
HbA1c, POC (controlled diabetic range): 6.1 % (ref 0.0–7.0)
HbA1c, POC (prediabetic range): 6.1 % (ref 5.7–6.4)
Hemoglobin A1C: 6.1 % — AB (ref 4.0–5.6)

## 2021-05-24 MED ORDER — ROSUVASTATIN CALCIUM 20 MG PO TABS: 20.0000 mg | ORAL_TABLET | Freq: Every day | ORAL | 1 refills | Status: DC

## 2021-05-24 MED ORDER — NIFEDIPINE ER OSMOTIC RELEASE 60 MG PO TB24: 60.0000 mg | ORAL_TABLET | Freq: Every day | ORAL | 1 refills | Status: DC

## 2021-05-24 NOTE — Patient Instructions (Signed)
It was great to see you!  Increase your nifedipine to 78m daily. You can take 2 tablets of the one now and 1 tablet after getting the new bottle.  Start rosuvastatin 225mdaily. If you develop more muscle cramps stop it and let me know.   Let's follow-up in 4-6 weeks, sooner if you have concerns.  If a referral was placed today, you will be contacted for an appointment. Please note that routine referrals can sometimes take up to 3-4 weeks to process. Please call our office if you haven't heard anything after this time frame.  Take care,  LaVance PeperNP

## 2021-05-25 DIAGNOSIS — E785 Hyperlipidemia, unspecified: Secondary | ICD-10-CM | POA: Insufficient documentation

## 2021-05-25 DIAGNOSIS — R7309 Other abnormal glucose: Secondary | ICD-10-CM | POA: Insufficient documentation

## 2021-05-25 NOTE — Assessment & Plan Note (Signed)
Chronic, improving. His BP today was 142/92 and his blood pressures at home have been 150s/80s. With blood pressure still not at goal, will increase nifedipine to 65m daily. Continue checking blood pressure at home. Follow-up in 4 weeks.

## 2021-05-25 NOTE — Assessment & Plan Note (Signed)
Last A1c was 6.5%, however his glucose was only 103. Repeat POC A1c today was 6.1% consistent with prediabetes. Discussed diet and exercise, specifically limiting carbohydrates and added sugars.

## 2021-05-25 NOTE — Assessment & Plan Note (Addendum)
ASCVD score 10.5%. Discussed the benefits of starting a statin and possible side effects. He would like to proceed. Will start rosuvastatin 42m daily. Also discussed continuing diet and exercise. Follow-up 4 weeks.

## 2021-07-22 ENCOUNTER — Other Ambulatory Visit: Payer: Self-pay

## 2021-07-22 MED ORDER — ROSUVASTATIN CALCIUM 20 MG PO TABS: 20.0000 mg | ORAL_TABLET | Freq: Every day | ORAL | 1 refills | Status: DC

## 2021-07-22 NOTE — Progress Notes (Signed)
Rx refill request for Rosuvastatin approved and sent to preferred pharmacy. Sw, cma

## 2021-11-13 DIAGNOSIS — E559 Vitamin D deficiency, unspecified: Secondary | ICD-10-CM | POA: Insufficient documentation

## 2021-11-13 NOTE — Progress Notes (Unsigned)
   Established Patient Office Visit  Subjective   Patient ID: Gary Bernard, male    DOB: 07/31/1970  Age: 51 y.o. MRN: [id number]  No chief complaint on file.   HPI  Gary Bernard is here to follow-up on hypertension and hyperlipidemia.   HYPERTENSION / HYPERLIPIDEMIA  Satisfied with current treatment? {Blank single:19197::"yes","no"} Duration of hypertension: {Blank single:19197::"chronic","months","years"} BP monitoring frequency: {Blank single:19197::"not checking","rarely","daily","weekly","monthly","a few times a day","a few times a week","a few times a month"} BP range:  BP medication side effects: {Blank single:19197::"yes","no"} Past BP meds: nifedipine Duration of hyperlipidemia: {Blank single:19197::"chronic","months","years"} Cholesterol medication side effects: {Blank single:19197::"yes","no"} Cholesterol supplements: {Blank multiple:19196::"none","fish oil","niacin","red yeast rice"} Past cholesterol medications: rosuvastatin Medication compliance: {Blank single:19197::"excellent compliance","good compliance","fair compliance","poor compliance"} Aspirin: {Blank single:19197::"yes","no"} Recent stressors: {Blank single:19197::"yes","no"} Recurrent headaches: {Blank single:19197::"yes","no"} Visual changes: {Blank single:19197::"yes","no"} Palpitations: {Blank single:19197::"yes","no"} Dyspnea: {Blank single:19197::"yes","no"} Chest pain: {Blank single:19197::"yes","no"} Lower extremity edema: {Blank single:19197::"yes","no"} Dizzy/lightheaded: {Blank single:19197::"yes","no"}   {History (Optional):23778}  ROS    Objective:     There were no vitals taken for this visit. {Vitals History (Optional):23777}  Physical Exam   No results found for any visits on 11/14/21.  {Labs (Optional):23779}  The 10-year ASCVD risk score (Arnett DK, et al., 2019) is: 10.5%    Assessment & Plan:   Problem List Items Addressed This Visit   None   No follow-ups on  file.    Gerre Scull, NP

## 2021-11-14 ENCOUNTER — Ambulatory Visit: Payer: Managed Care, Other (non HMO) | Admitting: Nurse Practitioner

## 2021-11-14 ENCOUNTER — Encounter: Payer: Self-pay | Admitting: Nurse Practitioner

## 2021-11-14 VITALS — BP 122/75 | HR 80 | Temp 98.3°F | Ht 67.0 in | Wt 198.0 lb

## 2021-11-14 DIAGNOSIS — E785 Hyperlipidemia, unspecified: Secondary | ICD-10-CM

## 2021-11-14 DIAGNOSIS — R3913 Splitting of urinary stream: Secondary | ICD-10-CM

## 2021-11-14 DIAGNOSIS — E559 Vitamin D deficiency, unspecified: Secondary | ICD-10-CM

## 2021-11-14 DIAGNOSIS — R7303 Prediabetes: Secondary | ICD-10-CM | POA: Diagnosis not present

## 2021-11-14 DIAGNOSIS — I10 Essential (primary) hypertension: Secondary | ICD-10-CM

## 2021-11-14 LAB — COMPREHENSIVE METABOLIC PANEL
ALT: 61 U/L — ABNORMAL HIGH (ref 0–53)
AST: 38 U/L — ABNORMAL HIGH (ref 0–37)
Albumin: 4.7 g/dL (ref 3.5–5.2)
Alkaline Phosphatase: 62 U/L (ref 39–117)
BUN: 14 mg/dL (ref 6–23)
CO2: 29 mEq/L (ref 19–32)
Calcium: 9.6 mg/dL (ref 8.4–10.5)
Chloride: 102 mEq/L (ref 96–112)
Creatinine, Ser: 0.82 mg/dL (ref 0.40–1.50)
GFR: 102.83 mL/min (ref >60.00)
Glucose, Bld: 100 mg/dL — ABNORMAL HIGH (ref 70–99)
Potassium: 4.2 mEq/L (ref 3.5–5.1)
Sodium: 138 mEq/L (ref 135–145)
Total Bilirubin: 0.3 mg/dL (ref 0.2–1.2)
Total Protein: 7.6 g/dL (ref 6.0–8.3)

## 2021-11-14 LAB — LIPID PANEL
Cholesterol: 128 mg/dL (ref 0–200)
HDL: 51.9 mg/dL (ref >39.00)
LDL Cholesterol: 56 mg/dL (ref 0–99)
NonHDL: 76.54
Total CHOL/HDL Ratio: 2
Triglycerides: 102 mg/dL (ref 0.0–149.0)
VLDL: 20.4 mg/dL (ref 0.0–40.0)

## 2021-11-14 LAB — PSA: PSA: 3.61 ng/mL (ref 0.10–4.00)

## 2021-11-14 LAB — CBC
HCT: 40.1 % (ref 39.0–52.0)
Hemoglobin: 13.2 g/dL (ref 13.0–17.0)
MCHC: 33.1 g/dL (ref 30.0–36.0)
MCV: 87.3 fl (ref 78.0–100.0)
Platelets: 344 10*3/uL (ref 150.0–400.0)
RBC: 4.59 Mil/uL (ref 4.22–5.81)
RDW: 13.1 % (ref 11.5–15.5)
WBC: 5.1 10*3/uL (ref 4.0–10.5)

## 2021-11-14 LAB — HEMOGLOBIN A1C: Hgb A1c MFr Bld: 6.4 % (ref 4.6–6.5)

## 2021-11-14 LAB — VITAMIN D 25 HYDROXY (VIT D DEFICIENCY, FRACTURES): VITD: 39.61 ng/mL (ref 30.00–100.00)

## 2021-11-14 NOTE — Patient Instructions (Signed)
It was great to see you!  We are checking your labs today and will let you know the results via mychart/phone.   Start saw palmetto supplement daily over the counter and see if this helps with your urination.   Keep up the great work!   Let's follow-up in 6 months, sooner if you have concerns.  If a referral was placed today, you will be contacted for an appointment. Please note that routine referrals can sometimes take up to 3-4 weeks to process. Please call our office if you haven't heard anything after this time frame.  Take care,  Rodman Pickle, NP

## 2021-11-14 NOTE — Assessment & Plan Note (Signed)
Chronic, stable. BP today 122/75. He brought his cuff from home and taught him to hold his arm near his heart since it is a wrist cuff. Cuff was accurate at visit today, he will continue checking his blood pressure at home. Continue nifedipine 60mg  daily. Check CMP, CBC today. Follow-up in 6 months.

## 2021-11-14 NOTE — Assessment & Plan Note (Signed)
He is taking rosuvastatin 20mg  daily. Will check lipid panel today and adjust regimen based on results.

## 2021-11-14 NOTE — Assessment & Plan Note (Signed)
Symptoms may be related to BPH. Will check PSA today. He can try saw palmetto over the counter. Follow-up if symptoms worsen or don't improve.

## 2021-11-14 NOTE — Assessment & Plan Note (Signed)
Check A1c today and treat based on results. He has been watching his nutrition and eating more salads. He lost 9 pounds since his last visit. Congratulated him on this.

## 2021-11-14 NOTE — Assessment & Plan Note (Signed)
Check vitamin D levels today and treat based on results. 

## 2021-12-05 ENCOUNTER — Other Ambulatory Visit: Payer: Self-pay | Admitting: Nurse Practitioner

## 2021-12-05 NOTE — Telephone Encounter (Signed)
Chart supports rx  Last ov: 11/14/21  Next ov: 05/15/22

## 2021-12-09 ENCOUNTER — Other Ambulatory Visit: Payer: Self-pay | Admitting: Nurse Practitioner

## 2021-12-10 ENCOUNTER — Encounter: Payer: Self-pay | Admitting: Nurse Practitioner

## 2021-12-10 MED ORDER — ROSUVASTATIN CALCIUM 20 MG PO TABS: 20.0000 mg | ORAL_TABLET | Freq: Every day | ORAL | 1 refills | Status: DC

## 2021-12-16 ENCOUNTER — Other Ambulatory Visit: Payer: Self-pay

## 2022-01-15 ENCOUNTER — Telehealth: Payer: Managed Care, Other (non HMO) | Admitting: Family

## 2022-01-15 ENCOUNTER — Telehealth: Payer: Managed Care, Other (non HMO) | Admitting: Physician Assistant

## 2022-01-15 DIAGNOSIS — R051 Acute cough: Secondary | ICD-10-CM

## 2022-01-15 DIAGNOSIS — U071 COVID-19: Secondary | ICD-10-CM | POA: Diagnosis not present

## 2022-01-15 MED ORDER — PREDNISONE 10 MG (21) PO TBPK: ORAL_TABLET | ORAL | 0 refills | Status: DC

## 2022-01-15 MED ORDER — PROMETHAZINE-DM 6.25-15 MG/5ML PO SYRP: 5.0000 mL | ORAL_SOLUTION | Freq: Three times a day (TID) | ORAL | 0 refills | Status: DC | PRN

## 2022-01-15 MED ORDER — BENZONATATE 100 MG PO CAPS: 100.0000 mg | ORAL_CAPSULE | Freq: Three times a day (TID) | ORAL | 0 refills | Status: DC | PRN

## 2022-01-15 NOTE — Progress Notes (Signed)
Virtual Visit Consent   Gary Bernard, you are scheduled for a virtual visit with a [facility] provider today. Just as with appointments in the office, your consent must be obtained to participate. Your consent will be active for this visit and any virtual visit you may have with one of our providers in the next 365 days. If you have a MyChart account, a copy of this consent can be sent to you electronically.  As this is a virtual visit, video technology does not allow for your provider to perform a traditional examination. This may limit your provider's ability to fully assess your condition. If your provider identifies any concerns that need to be evaluated in person or the need to arrange testing (such as labs, EKG, etc.), we will make arrangements to do so. Although advances in technology are sophisticated, we cannot ensure that it will always work on either your end or our end. If the connection with a video visit is poor, the visit may have to be switched to a telephone visit. With either a video or telephone visit, we are not always able to ensure that we have a secure connection.  By engaging in this virtual visit, you consent to the provision of healthcare and authorize for your insurance to be billed (if applicable) for the services provided during this visit. Depending on your insurance coverage, you may receive a charge related to this service.  I need to obtain your verbal consent now. Are you willing to proceed with your visit today? Gary Bernard has provided verbal consent on 01/15/2022 for a virtual visit (video or telephone). Evelina Dun, FNP  Date: 01/15/2022 12:19 PM  Virtual Visit via Video Note   I, Evelina Dun, connected with  Gary Bernard  ([id number], Aug 03, 1970) on 01/15/22 at 12:15 PM EST by a video-enabled telemedicine application and verified that I am speaking with the correct person using two identifiers.  Location: Patient: Virtual Visit Location Patient:  Home Provider: Virtual Visit Location Provider: Home Office   I discussed the limitations of evaluation and management by telemedicine and the availability of in person appointments. The patient expressed understanding and agreed to proceed.    History of Present Illness: Gary Bernard is a 52 y.o. who identifies as a male who was assigned male at birth, and is being seen today for cough from [place]. He reports he had COVID on 01/11/22. Report he continues to have a cough.   HPI: Cough This is a new problem. The current episode started in the past 7 days. The problem has been unchanged. The problem occurs constantly. The cough is Productive of sputum. Associated symptoms include nasal congestion, postnasal drip, rhinorrhea and a sore throat. Pertinent negatives include no chills, ear congestion, ear pain, fever, headaches, myalgias, shortness of breath or wheezing. Associated symptoms comments: Back pain . He has tried rest and OTC cough suppressant for the symptoms. The treatment provided mild relief.    Problems:  Patient Active Problem List   Diagnosis Date Noted   Prediabetes 11/14/2021   Intermittent urinary stream 11/14/2021   Vitamin D deficiency 11/13/2021   Elevated hemoglobin A1c 05/25/2021   Hyperlipidemia 05/25/2021   Primary hypertension 04/24/2021   Numbness and tingling 04/24/2021   Shortness of breath on exertion 04/24/2021   Snoring 04/24/2021    Allergies: No Known Allergies Medications:  Current Outpatient Medications:    benzonatate (TESSALON PERLES) 100 MG capsule, Take 1 capsule (100 mg total) by mouth 3 (three) times daily as needed.,  Disp: 20 capsule, Rfl: 0   predniSONE (STERAPRED UNI-PAK 21 TAB) 10 MG (21) TBPK tablet, Use as directed, Disp: 21 tablet, Rfl: 0   promethazine-dextromethorphan (PROMETHAZINE-DM) 6.25-15 MG/5ML syrup, Take 5 mLs by mouth 3 (three) times daily as needed for cough., Disp: 118 mL, Rfl: 0   aspirin EC 81 MG tablet, Take 81 mg by mouth  daily. Swallow whole., Disp: , Rfl:    ibuprofen (ADVIL) 800 MG tablet, Take 800 mg by mouth 3 (three) times daily as needed., Disp: , Rfl:    NIFEdipine (PROCARDIA XL/NIFEDICAL XL) 60 MG 24 hr tablet, TAKE 1 TABLET BY MOUTH DAILY, Disp: 30 tablet, Rfl: 1   rosuvastatin (CRESTOR) 20 MG tablet, Take 1 tablet (20 mg total) by mouth daily., Disp: 90 tablet, Rfl: 1  Observations/Objective: Patient is well-developed, well-nourished in no acute distress.  Resting comfortably  at home.  Head is normocephalic, atraumatic.  No labored breathing.  Speech is clear and coherent with logical content.  Patient is alert and oriented at baseline.  Nasal congestion   Assessment and Plan: 1. COVID-19 - predniSONE (STERAPRED UNI-PAK 21 TAB) 10 MG (21) TBPK tablet; Use as directed  Dispense: 21 tablet; Refill: 0 - benzonatate (TESSALON PERLES) 100 MG capsule; Take 1 capsule (100 mg total) by mouth 3 (three) times daily as needed.  Dispense: 20 capsule; Refill: 0 - promethazine-dextromethorphan (PROMETHAZINE-DM) 6.25-15 MG/5ML syrup; Take 5 mLs by mouth 3 (three) times daily as needed for cough.  Dispense: 118 mL; Refill: 0  COVID positive, rest, force fluids, tylenol as needed, Quarantine for at least 5 days and you are fever free, then must wear a mask out in public from day 4-19, report any worsening symptoms such as increased shortness of breath, swelling, or continued high fevers. Possible adverse effects discussed with antivirals.    Follow Up Instructions: I discussed the assessment and treatment plan with the patient. The patient was provided an opportunity to ask questions and all were answered. The patient agreed with the plan and demonstrated an understanding of the instructions.  A copy of instructions were sent to the patient via MyChart unless otherwise noted below.     The patient was advised to call back or seek an in-person evaluation if the symptoms worsen or if the condition fails to  improve as anticipated.  Time:  I spent 6 minutes with the patient via telehealth technology discussing the above problems/concerns.    Evelina Dun, FNP

## 2022-01-15 NOTE — Progress Notes (Signed)
Duplicate, has VV scheduled 1215, advised to keep  I have spent 5 minutes in review of e-visit questionnaire, review and updating patient chart, medical decision making and response to patient.   Mar Daring, PA-C

## 2022-03-09 ENCOUNTER — Other Ambulatory Visit: Payer: Self-pay | Admitting: Nurse Practitioner

## 2022-05-08 ENCOUNTER — Other Ambulatory Visit: Payer: Self-pay | Admitting: Nurse Practitioner

## 2022-05-15 ENCOUNTER — Encounter: Payer: Managed Care, Other (non HMO) | Admitting: Nurse Practitioner

## 2022-07-10 ENCOUNTER — Other Ambulatory Visit: Payer: Self-pay | Admitting: Nurse Practitioner

## 2022-08-10 ENCOUNTER — Other Ambulatory Visit: Payer: Self-pay | Admitting: Nurse Practitioner

## 2022-09-09 ENCOUNTER — Other Ambulatory Visit: Payer: Self-pay | Admitting: Nurse Practitioner

## 2022-09-09 NOTE — Telephone Encounter (Signed)
Requesting: NIFEDIPINE ER 60 MG TABLET, 24HR  Last Visit: 11/14/2021 Next Visit: Visit date not found Last Refill: 07/11/2022  Please Advise

## 2022-09-10 NOTE — Telephone Encounter (Signed)
Pt understood and said he will make his appt online.

## 2022-09-19 ENCOUNTER — Other Ambulatory Visit: Payer: Self-pay | Admitting: Nurse Practitioner

## 2022-09-19 NOTE — Telephone Encounter (Signed)
Requesting: NIFEDIPINE ER 60 MG TABLET, 24HR  Last Visit: 11/14/2021 Next Visit: 10/01/2022 Last Refill: 07/11/2022  Please Advise

## 2022-10-01 ENCOUNTER — Encounter: Payer: Self-pay | Admitting: Nurse Practitioner

## 2022-10-01 ENCOUNTER — Ambulatory Visit (INDEPENDENT_AMBULATORY_CARE_PROVIDER_SITE_OTHER): Payer: Managed Care, Other (non HMO) | Admitting: Nurse Practitioner

## 2022-10-01 VITALS — BP 124/82 | HR 75 | Temp 97.1°F | Ht 67.0 in | Wt 204.8 lb

## 2022-10-01 DIAGNOSIS — E559 Vitamin D deficiency, unspecified: Secondary | ICD-10-CM | POA: Diagnosis not present

## 2022-10-01 DIAGNOSIS — E785 Hyperlipidemia, unspecified: Secondary | ICD-10-CM | POA: Diagnosis not present

## 2022-10-01 DIAGNOSIS — M25511 Pain in right shoulder: Secondary | ICD-10-CM | POA: Diagnosis not present

## 2022-10-01 DIAGNOSIS — I10 Essential (primary) hypertension: Secondary | ICD-10-CM

## 2022-10-01 DIAGNOSIS — Z23 Encounter for immunization: Secondary | ICD-10-CM

## 2022-10-01 DIAGNOSIS — Z125 Encounter for screening for malignant neoplasm of prostate: Secondary | ICD-10-CM | POA: Diagnosis not present

## 2022-10-01 DIAGNOSIS — R7303 Prediabetes: Secondary | ICD-10-CM

## 2022-10-01 DIAGNOSIS — Z0000 Encounter for general adult medical examination without abnormal findings: Secondary | ICD-10-CM | POA: Insufficient documentation

## 2022-10-01 DIAGNOSIS — Z0001 Encounter for general adult medical examination with abnormal findings: Secondary | ICD-10-CM

## 2022-10-01 LAB — LIPID PANEL
Cholesterol: 149 mg/dL (ref 0–200)
HDL: 49.4 mg/dL (ref >39.00)
LDL Cholesterol: 73 mg/dL (ref 0–99)
NonHDL: 99.32
Total CHOL/HDL Ratio: 3
Triglycerides: 132 mg/dL (ref 0.0–149.0)
VLDL: 26.4 mg/dL (ref 0.0–40.0)

## 2022-10-01 LAB — COMPREHENSIVE METABOLIC PANEL
ALT: 81 U/L — ABNORMAL HIGH (ref 0–53)
AST: 40 U/L — ABNORMAL HIGH (ref 0–37)
Albumin: 4.5 g/dL (ref 3.5–5.2)
Alkaline Phosphatase: 68 U/L (ref 39–117)
BUN: 18 mg/dL (ref 6–23)
CO2: 26 mEq/L (ref 19–32)
Calcium: 9.6 mg/dL (ref 8.4–10.5)
Chloride: 105 mEq/L (ref 96–112)
Creatinine, Ser: 0.87 mg/dL (ref 0.40–1.50)
GFR: 100.39 mL/min (ref >60.00)
Glucose, Bld: 109 mg/dL — ABNORMAL HIGH (ref 70–99)
Potassium: 4 mEq/L (ref 3.5–5.1)
Sodium: 138 mEq/L (ref 135–145)
Total Bilirubin: 0.3 mg/dL (ref 0.2–1.2)
Total Protein: 7.4 g/dL (ref 6.0–8.3)

## 2022-10-01 LAB — CBC WITH DIFFERENTIAL/PLATELET
Basophils Absolute: 0 10*3/uL (ref 0.0–0.1)
Basophils Relative: 0.4 % (ref 0.0–3.0)
Eosinophils Absolute: 0.2 10*3/uL (ref 0.0–0.7)
Eosinophils Relative: 3.5 % (ref 0.0–5.0)
HCT: 42.8 % (ref 39.0–52.0)
Hemoglobin: 13.9 g/dL (ref 13.0–17.0)
Lymphocytes Relative: 40.7 % (ref 12.0–46.0)
Lymphs Abs: 1.9 10*3/uL (ref 0.7–4.0)
MCHC: 32.5 g/dL (ref 30.0–36.0)
MCV: 88.4 fl (ref 78.0–100.0)
Monocytes Absolute: 0.3 10*3/uL (ref 0.1–1.0)
Monocytes Relative: 7 % (ref 3.0–12.0)
Neutro Abs: 2.2 10*3/uL (ref 1.4–7.7)
Neutrophils Relative %: 48.4 % (ref 43.0–77.0)
Platelets: 315 10*3/uL (ref 150.0–400.0)
RBC: 4.84 Mil/uL (ref 4.22–5.81)
RDW: 13.4 % (ref 11.5–15.5)
WBC: 4.6 10*3/uL (ref 4.0–10.5)

## 2022-10-01 LAB — HEMOGLOBIN A1C: Hgb A1c MFr Bld: 6.4 % (ref 4.6–6.5)

## 2022-10-01 LAB — VITAMIN D 25 HYDROXY (VIT D DEFICIENCY, FRACTURES): VITD: 26.74 ng/mL — ABNORMAL LOW (ref 30.00–100.00)

## 2022-10-01 LAB — PSA: PSA: 3.19 ng/mL (ref 0.10–4.00)

## 2022-10-01 MED ORDER — MELOXICAM 15 MG PO TABS: 15.0000 mg | ORAL_TABLET | Freq: Every day | ORAL | 0 refills | Status: AC

## 2022-10-01 NOTE — Assessment & Plan Note (Signed)
Chronic, stable. BP today. Continue nifedipine 60mg  daily. Check CMP, CBC today. Follow-up in 6 months.

## 2022-10-01 NOTE — Assessment & Plan Note (Signed)
Check vitamin D levels today and treat based on results.

## 2022-10-01 NOTE — Assessment & Plan Note (Addendum)
Chronic, stable. Continue rosuvastatin 20mg  daily. Will check CMP, CBC, lipid panel today and adjust regimen based on results.

## 2022-10-01 NOTE — Assessment & Plan Note (Signed)
Health maintenance reviewed and updated. Discussed nutrition, exercise. Check CMP, CBC today. Follow-up 1 year.

## 2022-10-01 NOTE — Progress Notes (Signed)
BP 124/82 (BP Location: Left Arm)   Pulse 75   Temp (!) 97.1 F (36.2 C)   Ht 5\' 7"  (1.702 m)   Wt 204 lb 12.8 oz (92.9 kg)   SpO2 97%   BMI 32.08 kg/m    Subjective:    Patient ID: Gary Bernard, male    DOB: [birth year], 52 y.o.   MRN: [id number]  CC: Chief Complaint  Patient presents with  . Annual Exam    Concerns with right shoulder pain and joint pain    HPI: Gary Bernard is a 52 y.o. male presenting on 10/01/2022 for comprehensive medical examination. Current medical complaints include: right shoulder pain  He has been experiencing right shoulder pain intermittently for the last 2 weeks. 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 The pain radiates down his arm. He has not tried anything over the counter. He denies swelling, weakness, and rashes. He describes the pain as aching. He rates the pain as a 10/10 at times. He has trouble sleeping on his arm as well.   He currently lives with: wife  Depression and Anxiety Screen done today and results listed below:     10/01/2022   10:42 AM 11/14/2021    1:47 PM 11/14/2021    1:46 PM 11/14/2021    1:35 PM 05/24/2021    1:29 PM  Depression screen PHQ 2/9  Decreased Interest 0 0 0 0 3  Down, Depressed, Hopeless 0 0 0 0 3  PHQ - 2 Score 0 0 0 0 6  Altered sleeping 0 0 0  0  Tired, decreased energy 1 0 0  1  Change in appetite 0 0 0  0  Feeling bad or failure about yourself  0 0 0  0  Trouble concentrating 0 0 0  0  Moving slowly or fidgety/restless 0 1 0  0  Suicidal thoughts 0 0 0  0  PHQ-9 Score 1 1 0  7  Difficult doing work/chores Not difficult at all Not difficult at all   Somewhat difficult      10/01/2022   10:45 AM 05/24/2021    1:30 PM  GAD 7 : Generalized Anxiety Score  Nervous, Anxious, on Edge 0 1  Control/stop worrying 0 1  Worry too much - different things 0 1  Trouble relaxing 0 0  Restless 0 0  Easily annoyed or irritable 0 1  Afraid - awful might happen 0 1   Total GAD 7 Score 0 5  Anxiety Difficulty Not difficult at all Somewhat difficult    The patient does not have a history of falls. I did not complete a risk assessment for falls. A plan of care for falls was not documented.   Past Medical History:  Past Medical History:  Diagnosis Date  . Heart murmur   . Hyperlipidemia   . Hypertension   . Prediabetes     Surgical History:  History reviewed. No pertinent surgical history.  Medications:  Current Outpatient Medications on File Prior to Visit  Medication Sig  . NIFEdipine (PROCARDIA XL/NIFEDICAL XL) 60 MG 24 hr tablet TAKE 1 TABLET BY MOUTH DAILY  . rosuvastatin (CRESTOR) 20 MG tablet Take 1 tablet (20 mg total) by mouth daily. Need appointment for future refills  . aspirin EC 81 MG tablet Take 81 mg by mouth daily. Swallow whole. (Patient not taking: Reported on 10/01/2022)  . ibuprofen (ADVIL) 800 MG tablet Take 800  mg by mouth 3 (three) times daily as needed. (Patient not taking: Reported on 10/01/2022)   No current facility-administered medications on file prior to visit.    Allergies:  No Known Allergies  Social History:  Social History   Socioeconomic History  . Marital status: Married    Spouse name: Not on file  . Number of children: Not on file  . Years of education: Not on file  . Highest education level: Not on file  Occupational History  . Not on file  Tobacco Use  . Smoking status: Never  . Smokeless tobacco: Never  Vaping Use  . Vaping status: Never Used  Substance and Sexual Activity  . Alcohol use: Yes    Alcohol/week: 14.0 standard drinks of alcohol    Types: 14 Cans of beer per week  . Drug use: Never  . Sexual activity: Yes  Other Topics Concern  . Not on file  Social History Narrative  . Not on file   Social Determinants of Health   Financial Resource Strain: Not on file  Food Insecurity: Not on file  Transportation Needs: Not on file  Physical Activity: Not on file  Stress: Not on  file  Social Connections: Unknown (05/21/2021)   Received from [facility], [facility]   Social Network   . Social Network: Not on file  Intimate Partner Violence: Unknown (04/12/2021)   Received from [facility], [facility]   HITS   . Physically Hurt: Not on file   . Insult or Talk Down To: Not on file   . Threaten Physical Harm: Not on file   . Scream or Curse: Not on file   Social History   Tobacco Use  Smoking Status Never  Smokeless Tobacco Never   Social History   Substance and Sexual Activity  Alcohol Use Yes  . Alcohol/week: 14.0 standard drinks of alcohol  . Types: 14 Cans of beer per week    Family History:  Family History  Problem Relation Age of Onset  . Cancer Mother   . Hypertension Mother   . Hypertension Father     Past medical history, surgical history, medications, allergies, family history and social history reviewed with patient today and changes made to appropriate areas of the chart.   Review of Systems  Constitutional: Negative.   HENT: Negative.    Respiratory: Negative.    Cardiovascular: Negative.   Gastrointestinal: Negative.   Genitourinary: Negative.   Musculoskeletal:  Positive for joint pain (right shoulder).  Skin: Negative.   Neurological: Negative.   Psychiatric/Behavioral: Negative.     All other ROS negative except what is listed above and in the HPI.      Objective:    BP 124/82 (BP Location: Left Arm)   Pulse 75   Temp (!) 97.1 F (36.2 C)   Ht 5\' 7"  (1.702 m)   Wt 204 lb 12.8 oz (92.9 kg)   SpO2 97%   BMI 32.08 kg/m   Wt Readings from Last 3 Encounters:  10/01/22 204 lb 12.8 oz (92.9 kg)  11/14/21 198 lb (89.8 kg)  05/24/21 207 lb 3.2 oz (94 kg)    Physical Exam Vitals and nursing note reviewed.  Constitutional:      General: He is not in acute distress.    Appearance: Normal appearance.  HENT:     Head: Normocephalic and atraumatic.     Right Ear: Tympanic membrane, ear canal and external ear  normal.     Left Ear:  Tympanic membrane, ear canal and external ear normal.     Mouth/Throat:     Mouth: Mucous membranes are moist.     Pharynx: Oropharynx is clear. No posterior oropharyngeal erythema.  Eyes:     Conjunctiva/sclera: Conjunctivae normal.  Cardiovascular:     Rate and Rhythm: Normal rate and regular rhythm.     Pulses: Normal pulses.     Heart sounds: Normal heart sounds.  Pulmonary:     Effort: Pulmonary effort is normal.     Breath sounds: Normal breath sounds.  Abdominal:     Palpations: Abdomen is soft.     Tenderness: There is no abdominal tenderness.  Musculoskeletal:        General: No swelling or tenderness. Normal range of motion.     Cervical back: Normal range of motion and neck supple. No tenderness.     Right lower leg: No edema.     Left lower leg: No edema.     Comments: Negative empty can test bilaterally  Lymphadenopathy:     Cervical: No cervical adenopathy.  Skin:    General: Skin is warm and dry.  Neurological:     General: No focal deficit present.     Mental Status: He is alert and oriented to person, place, and time.     Cranial Nerves: No cranial nerve deficit.     Coordination: Coordination normal.     Gait: Gait normal.  Psychiatric:        Mood and Affect: Mood normal.        Behavior: Behavior normal.        Thought Content: Thought content normal.        Judgment: Judgment normal.    Results for orders placed or performed in visit on 11/14/21  CBC  Result Value Ref Range   WBC 5.1 4.0 - 10.5 K/uL   RBC 4.59 4.22 - 5.81 Mil/uL   Platelets 344.0 150.0 - 400.0 K/uL   Hemoglobin 13.2 13.0 - 17.0 g/dL   HCT 08.6 57.8 - 46.9 %   MCV 87.3 78.0 - 100.0 fl   MCHC 33.1 30.0 - 36.0 g/dL   RDW 62.9 52.8 - 41.3 %  Comprehensive metabolic panel  Result Value Ref Range   Sodium 138 135 - 145 mEq/L   Potassium 4.2 3.5 - 5.1 mEq/L   Chloride 102 96 - 112 mEq/L   CO2 29 19 - 32 mEq/L   Glucose, Bld 100 (H) 70 - 99 mg/dL   BUN 14 6  - 23 mg/dL   Creatinine, Ser 2.44 0.40 - 1.50 mg/dL   Total Bilirubin 0.3 0.2 - 1.2 mg/dL   Alkaline Phosphatase 62 39 - 117 U/L   AST 38 (H) 0 - 37 U/L   ALT 61 (H) 0 - 53 U/L   Total Protein 7.6 6.0 - 8.3 g/dL   Albumin 4.7 3.5 - 5.2 g/dL   GFR 010.27 >25.36 mL/min   Calcium 9.6 8.4 - 10.5 mg/dL  Lipid panel  Result Value Ref Range   Cholesterol 128 0 - 200 mg/dL   Triglycerides 644.0 0.0 - 149.0 mg/dL   HDL 34.74 >25.95 mg/dL   VLDL 63.8 0.0 - 75.6 mg/dL   LDL Cholesterol 56 0 - 99 mg/dL   Total CHOL/HDL Ratio 2    NonHDL 76.54   VITAMIN D 25 Hydroxy (Vit-D Deficiency, Fractures)  Result Value Ref Range   VITD 39.61 30.00 - 100.00 ng/mL  Hemoglobin A1c  Result Value Ref Range  Hgb A1c MFr Bld 6.4 4.6 - 6.5 %  PSA  Result Value Ref Range   PSA 3.61 0.10 - 4.00 ng/mL      Assessment & Plan:   Problem List Items Addressed This Visit       Cardiovascular and Mediastinum   Primary hypertension    Chronic, stable. BP today. Continue nifedipine 60mg  daily. Check CMP, CBC today. Follow-up in 6 months.       Relevant Orders   CBC with Differential/Platelet   Comprehensive metabolic panel     Other   Hyperlipidemia    Chronic, stable. Continue rosuvastatin 20mg  daily. Will check CMP, CBC, lipid panel today and adjust regimen based on results.       Relevant Orders   CBC with Differential/Platelet   Comprehensive metabolic panel   Lipid panel   Vitamin D deficiency    Check vitamin D levels today and treat based on results.       Relevant Orders   VITAMIN D 25 Hydroxy (Vit-D Deficiency, Fractures)   Prediabetes    Check A1c today and treat based on results. He states that he got off his diet that he was following. Encouraged limiting sugars and increasing exercise as able.       Relevant Orders   Hemoglobin A1c   Routine general medical examination at a health care facility - Primary    Health maintenance reviewed and updated. Discussed nutrition, exercise.  Check CMP, CBC today. Follow-up 1 year.       Other Visit Diagnoses     Acute pain of right shoulder       Start meloxciam 15mg  daily with food prn pain. gave stretches to do daily. Can also use heat/ice. Follow-up in 3 months or sooner with concerns.   Screening PSA (prostate specific antigen)       Screen PSA today   Relevant Orders   PSA   Immunization due       Flu and shingrix #1 given today   Relevant Orders   Flu vaccine trivalent PF, 6mos and older(Flulaval,Afluria,Fluarix,Fluzone) (Completed)   Varicella-zoster vaccine IM (Completed)        LABORATORY TESTING:  Health maintenance labs ordered today as discussed above.   The natural history of prostate cancer and ongoing controversy regarding screening and potential treatment outcomes of prostate cancer has been discussed with the patient. The meaning of a false positive PSA and a false negative PSA has been discussed. He indicates understanding of the limitations of this screening test and wishes to proceed with screening PSA testing.   IMMUNIZATIONS:   - Tdap: Tetanus vaccination status reviewed: last tetanus booster within 10 years. - Influenza: Administered today - Pneumovax: Not applicable - Prevnar: Not applicable - HPV: Not applicable - Shingrix vaccine: Administered today  SCREENING: - Colonoscopy: Up to date  Discussed with patient purpose of the colonoscopy is to detect colon cancer at curable precancerous or early stages   - AAA Screening: Not applicable   PATIENT COUNSELING:    Sexuality: Discussed sexually transmitted diseases, partner selection, use of condoms, avoidance of unintended pregnancy  and contraceptive alternatives.   Advised to avoid cigarette smoking.  I discussed with the patient that most people either abstain from alcohol or drink within safe limits (<=14/week and <=4 drinks/occasion for males, <=7/weeks and <= 3 drinks/occasion for females) and that the risk for alcohol disorders  and other health effects rises proportionally with the number of drinks per week and how often a drinker  exceeds daily limits.  Discussed cessation/primary prevention of drug use and availability of treatment for abuse.   Diet: Encouraged to adjust caloric intake to maintain  or achieve ideal body weight, to reduce intake of dietary saturated fat and total fat, to limit sodium intake by avoiding high sodium foods and not adding table salt, and to maintain adequate dietary potassium and calcium preferably from fresh fruits, vegetables, and low-fat dairy products.    stressed the importance of regular exercise  Injury prevention: Discussed safety belts, safety helmets, smoke detector, smoking near bedding or upholstery.   Dental health: Discussed importance of regular tooth brushing, flossing, and dental visits.   Follow up plan: NEXT PREVENTATIVE PHYSICAL DUE IN 1 YEAR. Return in about 3 months (around 12/31/2022) for 3-4 months, second shingles, shoulder pain.  Havier Deeb A Wynn Alldredge

## 2022-10-01 NOTE — Assessment & Plan Note (Signed)
Check A1c today and treat based on results. He states that he got off his diet that he was following. Encouraged limiting sugars and increasing exercise as able.

## 2022-10-01 NOTE — Patient Instructions (Signed)
It was great to see you!  We are checking your labs today and will let you know the results via mychart/phone.   Start the attached stretches daily  Start meloxicam once a day as needed with food for your shoulder pain  Let's follow-up in 3-4 months, sooner if you have concerns.  If a referral was placed today, you will be contacted for an appointment. Please note that routine referrals can sometimes take up to 3-4 weeks to process. Please call our office if you haven't heard anything after this time frame.  Take care,  Rodman Pickle, NP

## 2022-11-06 ENCOUNTER — Other Ambulatory Visit: Payer: Self-pay | Admitting: Nurse Practitioner

## 2022-11-06 NOTE — Telephone Encounter (Signed)
Requesting: ROSUVASTATIN CALCIUM 20 MG TAB  Last Visit: 10/01/2022 Next Visit: 12/26/2022 Last Refill: 08/11/2022  Please Advise

## 2022-11-17 ENCOUNTER — Other Ambulatory Visit: Payer: Self-pay | Admitting: Nurse Practitioner

## 2022-11-17 NOTE — Telephone Encounter (Signed)
Requesting: NIFEDIPINE ER 60 MG TABLET, 24HR  Last Visit: 10/01/2022 Next Visit: 12/26/2022 Last Refill: 09/19/2022  Please Advise

## 2022-12-26 ENCOUNTER — Ambulatory Visit: Payer: Managed Care, Other (non HMO) | Admitting: Nurse Practitioner

## 2022-12-26 ENCOUNTER — Encounter: Payer: Self-pay | Admitting: Nurse Practitioner

## 2022-12-26 VITALS — BP 122/82 | HR 70 | Temp 97.7°F | Ht 67.0 in | Wt 203.6 lb

## 2022-12-26 DIAGNOSIS — R7303 Prediabetes: Secondary | ICD-10-CM | POA: Diagnosis not present

## 2022-12-26 DIAGNOSIS — E559 Vitamin D deficiency, unspecified: Secondary | ICD-10-CM

## 2022-12-26 DIAGNOSIS — M25511 Pain in right shoulder: Secondary | ICD-10-CM

## 2022-12-26 DIAGNOSIS — I10 Essential (primary) hypertension: Secondary | ICD-10-CM | POA: Diagnosis not present

## 2022-12-26 DIAGNOSIS — Z23 Encounter for immunization: Secondary | ICD-10-CM

## 2022-12-26 DIAGNOSIS — E785 Hyperlipidemia, unspecified: Secondary | ICD-10-CM

## 2022-12-26 MED ORDER — ROSUVASTATIN CALCIUM 20 MG PO TABS: 20.0000 mg | ORAL_TABLET | Freq: Every day | ORAL | 3 refills | Status: DC

## 2022-12-26 MED ORDER — NIFEDIPINE ER OSMOTIC RELEASE 60 MG PO TB24: 60.0000 mg | ORAL_TABLET | Freq: Every day | ORAL | 3 refills | Status: DC

## 2022-12-26 NOTE — Assessment & Plan Note (Signed)
Labs show excellent control. We will continue current management with rosuvastatin 20mg  daily.

## 2022-12-26 NOTE — Assessment & Plan Note (Signed)
Improvement is noted with supplementation, currently taking 2000 units daily. We will continue current supplementation.

## 2022-12-26 NOTE — Patient Instructions (Signed)
It was great to see you!  I have refilled your medications  Let's follow-up in 9 months, sooner if you have concerns.  If a referral was placed today, you will be contacted for an appointment. Please note that routine referrals can sometimes take up to 3-4 weeks to process. Please call our office if you haven't heard anything after this time frame.  Take care,  Rodman Pickle, NP

## 2022-12-26 NOTE — Progress Notes (Signed)
Established Patient Office Visit  Subjective   Patient ID: Gary Bernard, male    DOB: 11/30/1970  Age: 52 y.o. MRN: [id number]  Chief Complaint  Patient presents with   Hypertension    Follow up, 2nd Shingrix Vaccine    HPI  Discussed the use of AI scribe software for clinical note transcription with the patient, who gave verbal consent to proceed.  History of Present Illness   The patient presents for a follow-up visit after experiencing shoulder pain. He reports that his shoulder has been doing well and he has been taking meloxicam as needed. He has also started doing stretches as recommended.  The patient's blood pressure is well controlled at 122/82. He is due for a refill on his medications. Lab results show excellent cholesterol levels, but borderline blood sugar levels. The patient admits to having eaten sweets prior to the test, which may have elevated the results. He denies chest pain and shortness of breath. His vitamin D levels are slightly low, but improved from previous tests.       ROS See pertinent positives and negatives per HPI.    Objective:     BP 122/82 (BP Location: Left Arm, Patient Position: Sitting, Cuff Size: Normal)   Pulse 70   Temp 97.7 F (36.5 C) (Oral)   Ht 5\' 7"  (1.702 m)   Wt 203 lb 9.6 oz (92.4 kg)   SpO2 97%   BMI 31.89 kg/m  BP Readings from Last 3 Encounters:  12/26/22 122/82  10/01/22 124/82  11/14/21 122/75   Wt Readings from Last 3 Encounters:  12/26/22 203 lb 9.6 oz (92.4 kg)  10/01/22 204 lb 12.8 oz (92.9 kg)  11/14/21 198 lb (89.8 kg)      Physical Exam Vitals and nursing note reviewed.  Constitutional:      Appearance: Normal appearance.  HENT:     Head: Normocephalic.  Eyes:     Conjunctiva/sclera: Conjunctivae normal.  Cardiovascular:     Rate and Rhythm: Normal rate and regular rhythm.     Pulses: Normal pulses.     Heart sounds: Normal heart sounds.  Pulmonary:     Effort: Pulmonary effort is normal.      Breath sounds: Normal breath sounds.  Musculoskeletal:     Cervical back: Normal range of motion.  Skin:    General: Skin is warm.  Neurological:     General: No focal deficit present.     Mental Status: He is alert and oriented to person, place, and time.  Psychiatric:        Mood and Affect: Mood normal.        Behavior: Behavior normal.        Thought Content: Thought content normal.        Judgment: Judgment normal.    The 10-year ASCVD risk score (Arnett DK, et al., 2019) is: 7.8%    Assessment & Plan:   Problem List Items Addressed This Visit       Cardiovascular and Mediastinum   Primary hypertension - Primary   Well controlled with the current medication regimen, with a blood pressure reading today of 122/82. We will refill nifedipine 60mg  daily. Follow-up in 9 months.       Relevant Medications   NIFEdipine (PROCARDIA XL/NIFEDICAL XL) 60 MG 24 hr tablet   rosuvastatin (CRESTOR) 20 MG tablet     Other   Hyperlipidemia   Labs show excellent control. We will continue current management with rosuvastatin 20mg  daily.  Relevant Medications   NIFEdipine (PROCARDIA XL/NIFEDICAL XL) 60 MG 24 hr tablet   rosuvastatin (CRESTOR) 20 MG tablet   Vitamin D deficiency   Improvement is noted with supplementation, currently taking 2000 units daily. We will continue current supplementation.      Prediabetes   Borderline elevated sugars are noted on recent labs. He reports recent dietary indiscretion which may have contributed. We will continue monitoring and lifestyle modifications.      Other Visit Diagnoses       Acute pain of right shoulder       Improvement is noted with intermittent use of Meloxicam and regular stretching exercises. We will continue current management as needed.     Immunization due       Shingrix #2 given today   Relevant Orders   Varicella-zoster vaccine IM (Completed)       Return in about 9 months (around 09/26/2023) for CPE.     Gerre Scull, NP

## 2022-12-26 NOTE — Assessment & Plan Note (Signed)
Well controlled with the current medication regimen, with a blood pressure reading today of 122/82. We will refill nifedipine 60mg  daily. Follow-up in 9 months.

## 2022-12-26 NOTE — Assessment & Plan Note (Signed)
Borderline elevated sugars are noted on recent labs. He reports recent dietary indiscretion which may have contributed. We will continue monitoring and lifestyle modifications.

## 2023-09-28 ENCOUNTER — Encounter: Payer: Managed Care, Other (non HMO) | Admitting: Nurse Practitioner

## 2023-10-05 ENCOUNTER — Encounter: Payer: Managed Care, Other (non HMO) | Admitting: Nurse Practitioner

## 2023-10-19 ENCOUNTER — Encounter: Admitting: Nurse Practitioner

## 2023-10-22 ENCOUNTER — Encounter: Payer: Self-pay | Admitting: Nurse Practitioner

## 2023-10-22 ENCOUNTER — Ambulatory Visit (INDEPENDENT_AMBULATORY_CARE_PROVIDER_SITE_OTHER): Admitting: Nurse Practitioner

## 2023-10-22 VITALS — BP 124/86 | HR 73 | Temp 96.9°F | Ht 67.0 in | Wt 208.6 lb

## 2023-10-22 DIAGNOSIS — E785 Hyperlipidemia, unspecified: Secondary | ICD-10-CM | POA: Diagnosis not present

## 2023-10-22 DIAGNOSIS — I10 Essential (primary) hypertension: Secondary | ICD-10-CM | POA: Diagnosis not present

## 2023-10-22 DIAGNOSIS — E559 Vitamin D deficiency, unspecified: Secondary | ICD-10-CM

## 2023-10-22 DIAGNOSIS — Z23 Encounter for immunization: Secondary | ICD-10-CM | POA: Diagnosis not present

## 2023-10-22 DIAGNOSIS — R7303 Prediabetes: Secondary | ICD-10-CM

## 2023-10-22 DIAGNOSIS — Z0000 Encounter for general adult medical examination without abnormal findings: Secondary | ICD-10-CM | POA: Diagnosis not present

## 2023-10-22 LAB — CBC WITH DIFFERENTIAL/PLATELET
Basophils Absolute: 0 K/uL (ref 0.0–0.1)
Basophils Relative: 0.4 % (ref 0.0–3.0)
Eosinophils Absolute: 0.2 K/uL (ref 0.0–0.7)
Eosinophils Relative: 4.4 % (ref 0.0–5.0)
HCT: 40.6 % (ref 39.0–52.0)
Hemoglobin: 13.2 g/dL (ref 13.0–17.0)
Lymphocytes Relative: 42.7 % (ref 12.0–46.0)
Lymphs Abs: 2 K/uL (ref 0.7–4.0)
MCHC: 32.4 g/dL (ref 30.0–36.0)
MCV: 87.7 fl (ref 78.0–100.0)
Monocytes Absolute: 0.4 K/uL (ref 0.1–1.0)
Monocytes Relative: 7.6 % (ref 3.0–12.0)
Neutro Abs: 2.1 K/uL (ref 1.4–7.7)
Neutrophils Relative %: 44.9 % (ref 43.0–77.0)
Platelets: 321 K/uL (ref 150.0–400.0)
RBC: 4.63 Mil/uL (ref 4.22–5.81)
RDW: 13.3 % (ref 11.5–15.5)
WBC: 4.6 K/uL (ref 4.0–10.5)

## 2023-10-22 LAB — COMPREHENSIVE METABOLIC PANEL WITH GFR
ALT: 77 U/L — ABNORMAL HIGH (ref 0–53)
AST: 43 U/L — ABNORMAL HIGH (ref 0–37)
Albumin: 4.7 g/dL (ref 3.5–5.2)
Alkaline Phosphatase: 73 U/L (ref 39–117)
BUN: 12 mg/dL (ref 6–23)
CO2: 26 meq/L (ref 19–32)
Calcium: 9.5 mg/dL (ref 8.4–10.5)
Chloride: 102 meq/L (ref 96–112)
Creatinine, Ser: 0.8 mg/dL (ref 0.40–1.50)
GFR: 102.2 mL/min (ref >60.00)
Glucose, Bld: 102 mg/dL — ABNORMAL HIGH (ref 70–99)
Potassium: 3.7 meq/L (ref 3.5–5.1)
Sodium: 138 meq/L (ref 135–145)
Total Bilirubin: 0.4 mg/dL (ref 0.2–1.2)
Total Protein: 7.7 g/dL (ref 6.0–8.3)

## 2023-10-22 LAB — LIPID PANEL
Cholesterol: 154 mg/dL (ref 0–200)
HDL: 48.7 mg/dL (ref >39.00)
LDL Cholesterol: 90 mg/dL (ref 0–99)
NonHDL: 105.72
Total CHOL/HDL Ratio: 3
Triglycerides: 77 mg/dL (ref 0.0–149.0)
VLDL: 15.4 mg/dL (ref 0.0–40.0)

## 2023-10-22 LAB — VITAMIN D 25 HYDROXY (VIT D DEFICIENCY, FRACTURES): VITD: 31.67 ng/mL (ref 30.00–100.00)

## 2023-10-22 LAB — HEMOGLOBIN A1C: Hgb A1c MFr Bld: 6.2 % (ref 4.6–6.5)

## 2023-10-22 IMAGING — CT CT HEAD W/O CM
4 series · 15 of 47 positions shown, 17 images · non-contrast
Comparison: None.

CLINICAL DATA: Recent fall with head and neck pain, initial
encounter



[Series 3: head without · axial · non-contrast · 0.43mm/px · z∈[-114,+6]mm · 7 of 32 slices shown, 9 images]
[im 4/32  brain]
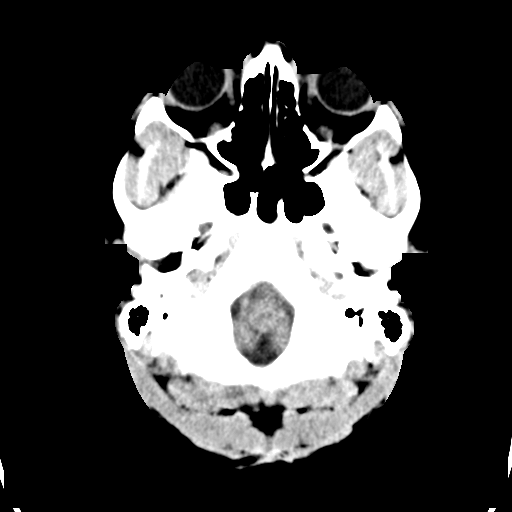
[im 4/32  bone]
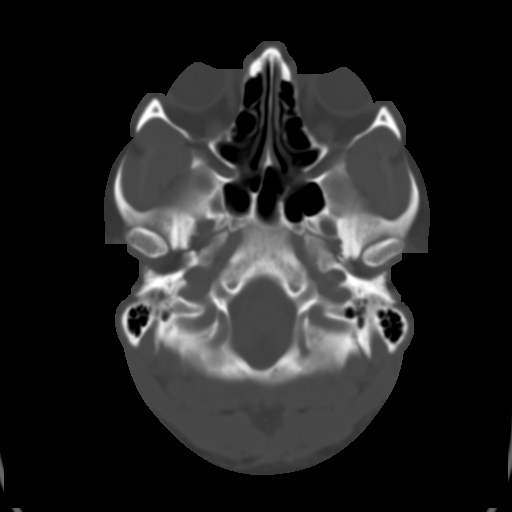
[im 8/32  brain]
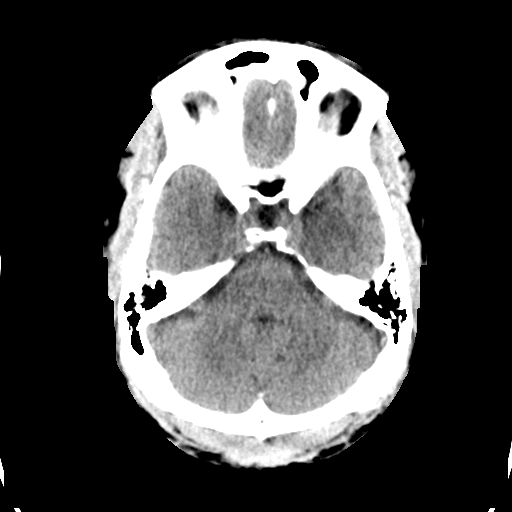
[im 12/32  brain]
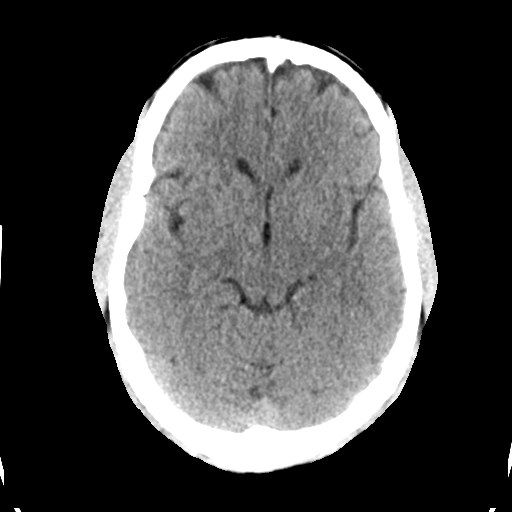
[im 16/32  brain]
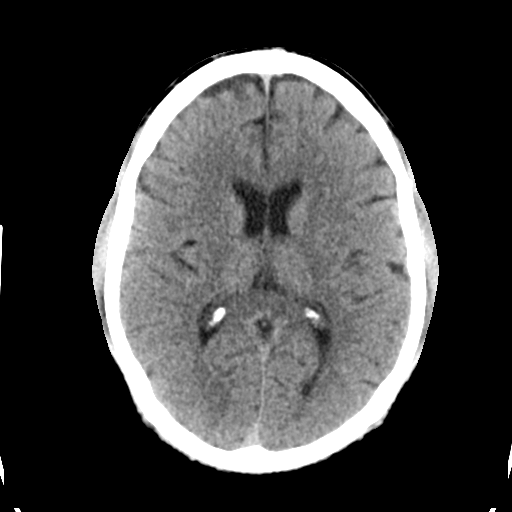
[im 20/32  brain]
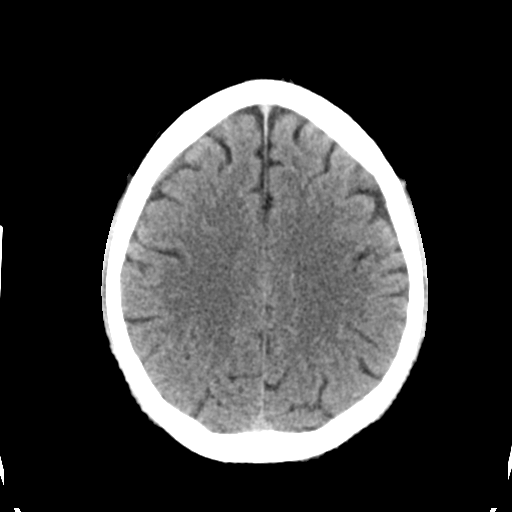
[im 20/32  bone]
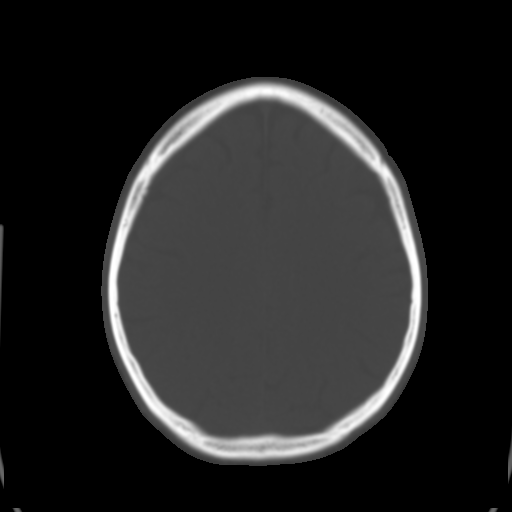
[im 24/32  brain]
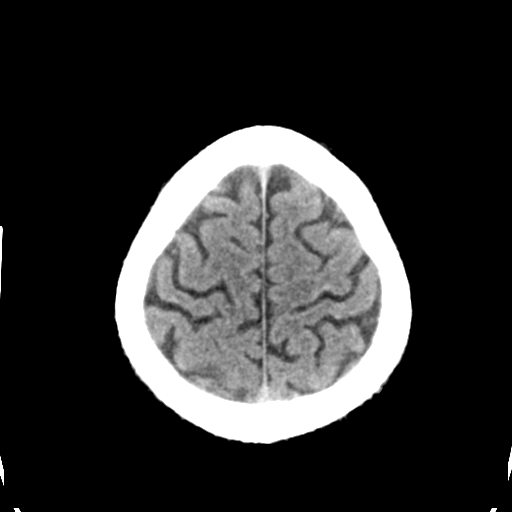
[im 28/32  brain]
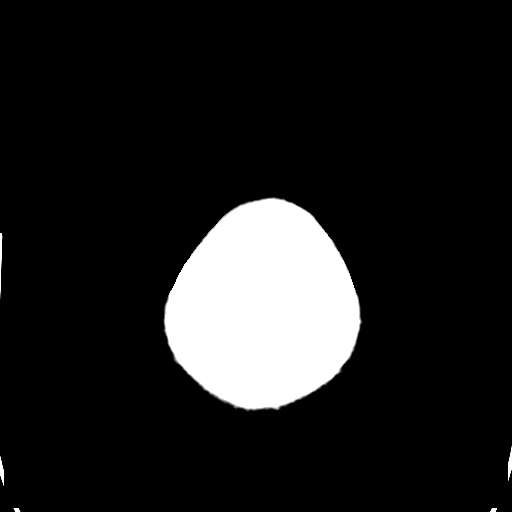

[Series 4: head bone · axial · 0.43mm/px · z∈[-115,-99]mm · 2 of 80 slices shown]
[im 8/80  bone]
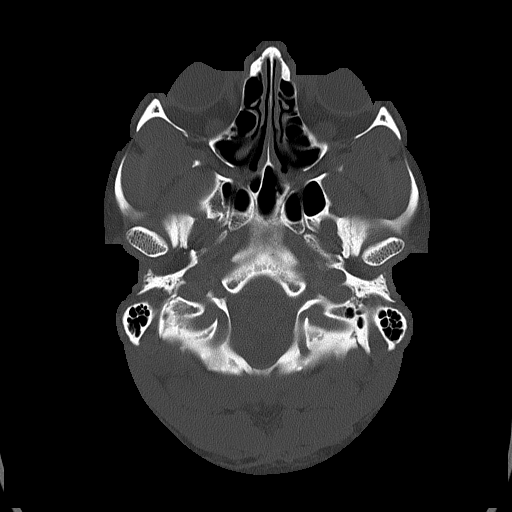
[im 16/80  bone]
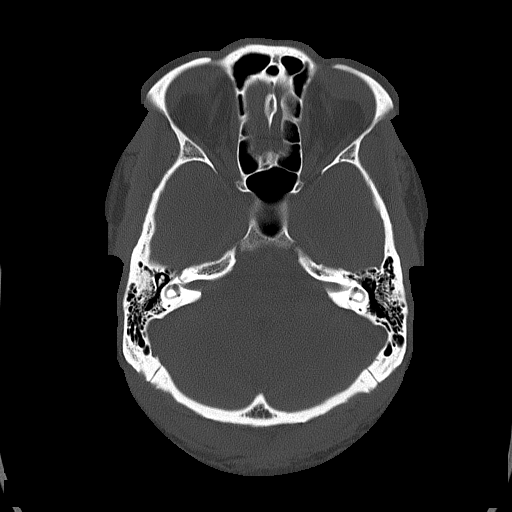

[Series 5: head without cor · coronal · non-contrast · 0.30mm/px · 3 of 70 slices shown]
[im 24/70  brain]
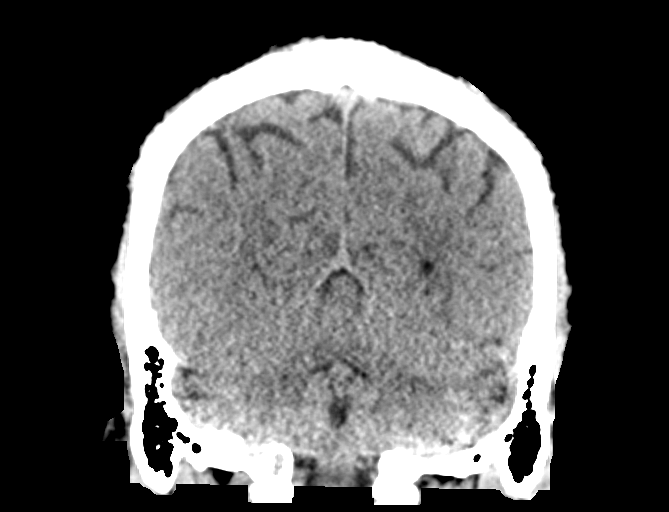
[im 31/70  brain]
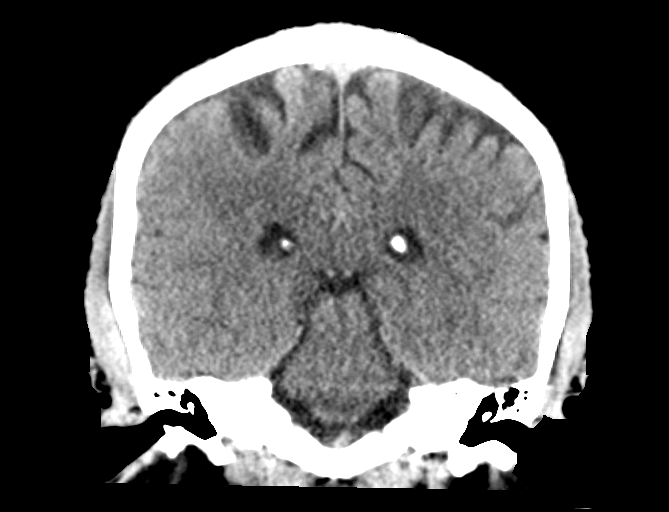
[im 39/70  brain]
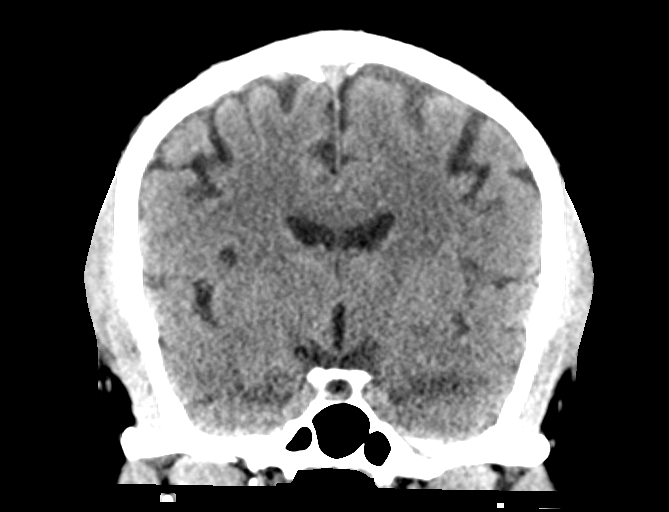

[Series 6: head without sag · sagittal · non-contrast · 0.30mm/px · 3 of 56 slices shown]
[im 19/56  brain]
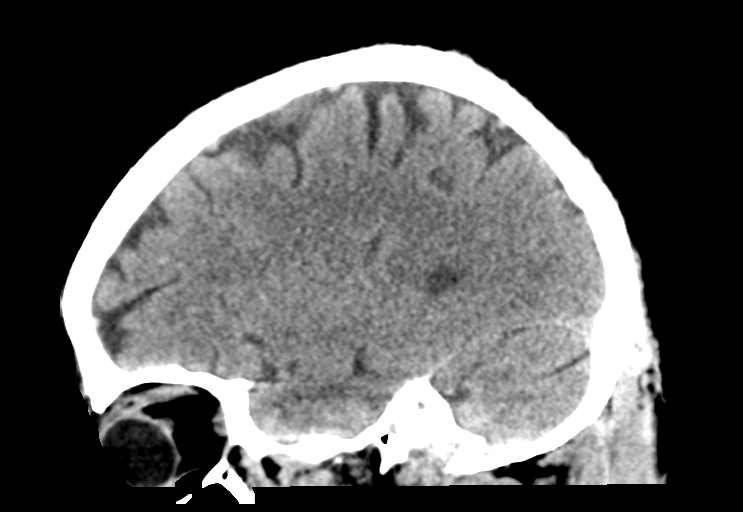
[im 28/56  brain]
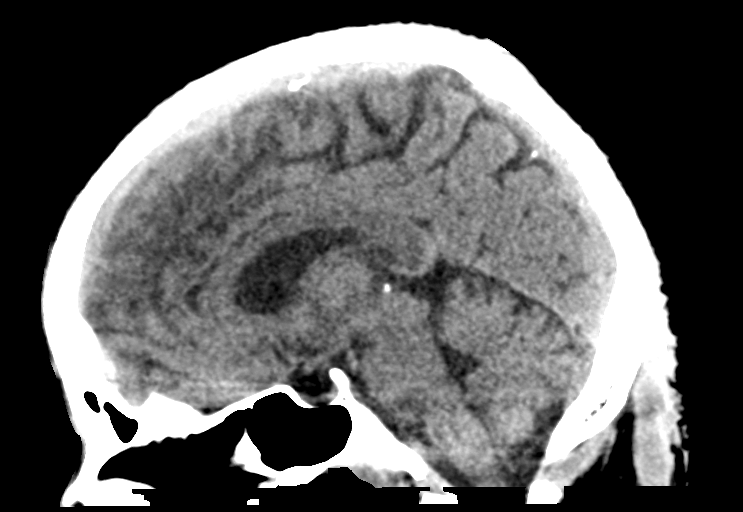
[im 37/56  brain]
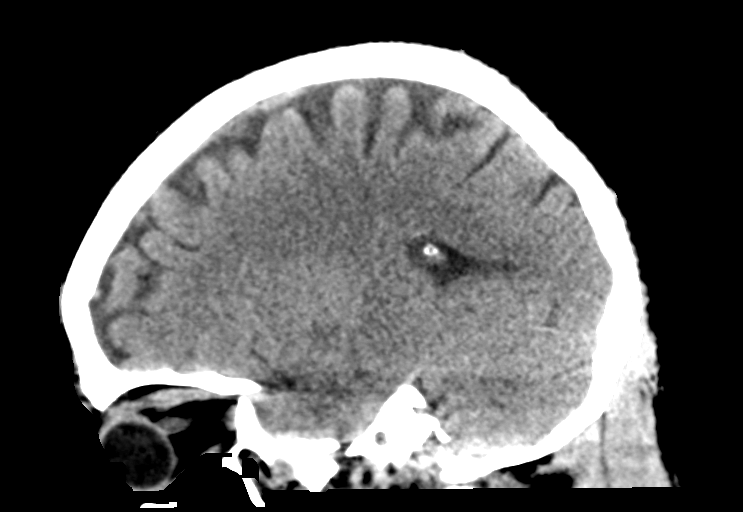

[15 of 47 positions shown; findings below may reference images not displayed]

FINDINGS: CT HEAD FINDINGS

Brain: No evidence of acute infarction, hemorrhage, hydrocephalus,
extra-axial collection or mass lesion/mass effect.

Vascular: No hyperdense vessel or unexpected calcification.

Skull: Normal. Negative for fracture or focal lesion.

Sinuses/Orbits: No acute finding.

Other: None.

CT CERVICAL SPINE FINDINGS

Alignment: Within normal limits.

Skull base and vertebrae: 7 cervical segments are well visualized.
Vertebral body height is well maintained. No acute fracture or acute
facet abnormality is noted. The odontoid is within normal limits.

Soft tissues and spinal canal: Surrounding soft tissue structures
are within normal limits.

Upper chest: Visualized lung apices are unremarkable.

Other: None
IMPRESSION: CT of the head: No acute intracranial abnormality noted.

CT of the cervical spine: No acute abnormality noted.

## 2023-10-22 NOTE — Patient Instructions (Signed)
It was great to see you!  We are checking your labs today and will let you know the results via mychart/phone.   Keep up the great work!   Let's follow-up in 1 year, sooner if you have concerns.  If a referral was placed today, you will be contacted for an appointment. Please note that routine referrals can sometimes take up to 3-4 weeks to process. Please call our office if you haven't heard anything after this time frame.  Take care,  Nivin Braniff, NP  

## 2023-10-22 NOTE — Progress Notes (Signed)
 BP 124/86 (BP Location: Left Arm, Cuff Size: Normal)   Pulse 73   Temp (!) 96.9 F (36.1 C) (Temporal)   Ht 5' 7 (1.702 m)   Wt 208 lb 9.6 oz (94.6 kg)   SpO2 97%   BMI 32.67 kg/m    Subjective:    Patient ID: Gary Bernard, male    DOB: 02/09/1970, 53 y.o.   MRN: [id number]  CC: Chief Complaint  Patient presents with   Annual Exam    Physical exam. Not fasting. Flu shot.    HPI: Gary Bernard is a 53 y.o. male presenting on 10/22/2023 for comprehensive medical examination. Current medical complaints include:none  Depression and Anxiety Screen done today and results listed below:     10/22/2023    1:43 PM 10/01/2022   10:42 AM 11/14/2021    1:47 PM 11/14/2021    1:46 PM 11/14/2021    1:35 PM  Depression screen PHQ 2/9  Decreased Interest 0 0 0 0 0  Down, Depressed, Hopeless 0 0 0 0 0  PHQ - 2 Score 0 0 0 0 0  Altered sleeping 0 0 0 0   Tired, decreased energy 0 1 0 0   Change in appetite 0 0 0 0   Feeling bad or failure about yourself  0 0 0 0   Trouble concentrating 0 0 0 0   Moving slowly or fidgety/restless 0 0 1 0   Suicidal thoughts 0 0 0 0   PHQ-9 Score 0 1 1 0   Difficult doing work/chores Not difficult at all Not difficult at all Not difficult at all        10/22/2023    1:43 PM 10/01/2022   10:45 AM 05/24/2021    1:30 PM  GAD 7 : Generalized Anxiety Score  Nervous, Anxious, on Edge 0 0 1  Control/stop worrying 0 0 1  Worry too much - different things 0 0 1  Trouble relaxing 0 0 0  Restless 0 0 0  Easily annoyed or irritable 0 0 1  Afraid - awful might happen 0 0 1  Total GAD 7 Score 0 0 5  Anxiety Difficulty Not difficult at all Not difficult at all Somewhat difficult    The patient does not have a history of falls. I did not complete a risk assessment for falls. A plan of care for falls was not documented.   Past Medical History:  Past Medical History:  Diagnosis Date   GERD (gastroesophageal reflux disease)    Heart murmur     Hyperlipidemia    Hypertension    Prediabetes     Surgical History:  History reviewed. No pertinent surgical history.  Medications:  Current Outpatient Medications on File Prior to Visit  Medication Sig   aspirin EC 81 MG tablet Take 81 mg by mouth daily. Swallow whole.   cholecalciferol (VITAMIN D3) 25 MCG (1000 UNIT) tablet Take 2,000 Units by mouth daily.   ibuprofen (ADVIL) 800 MG tablet Take 800 mg by mouth 3 (three) times daily as needed.   meloxicam  (MOBIC ) 15 MG tablet Take 1 tablet (15 mg total) by mouth daily.   NIFEdipine  (PROCARDIA  XL/NIFEDICAL XL) 60 MG 24 hr tablet Take 1 tablet (60 mg total) by mouth daily.   rosuvastatin  (CRESTOR ) 20 MG tablet Take 1 tablet (20 mg total) by mouth daily.   No current facility-administered medications on file prior to visit.    Allergies:  No Known Allergies  Social History:  Social History   Socioeconomic History   Marital status: Married    Spouse name: Not on file   Number of children: Not on file   Years of education: Not on file   Highest education level: 12th grade  Occupational History   Not on file  Tobacco Use   Smoking status: Former    Types: Cigarettes   Smokeless tobacco: Never  Vaping Use   Vaping status: Never Used  Substance and Sexual Activity   Alcohol use: Yes    Alcohol/week: 4.0 standard drinks of alcohol    Types: 4 Cans of beer per week   Drug use: Never   Sexual activity: Yes  Other Topics Concern   Not on file  Social History Narrative   Not on file   Social Drivers of Health   Financial Resource Strain: Low Risk  (10/18/2023)   Overall Financial Resource Strain (CARDIA)    Difficulty of Paying Living Expenses: Not hard at all  Food Insecurity: No Food Insecurity (10/18/2023)   Hunger Vital Sign    Worried About Running Out of Food in the Last Year: Never true    Ran Out of Food in the Last Year: Never true  Transportation Needs: No Transportation Needs (10/18/2023)   PRAPARE -  Administrator, Civil Service (Medical): No    Lack of Transportation (Non-Medical): No  Physical Activity: Sufficiently Active (10/18/2023)   Exercise Vital Sign    Days of Exercise per Week: 5 days    Minutes of Exercise per Session: 50 min  Stress: No Stress Concern Present (10/18/2023)   [employer] of Occupational Health - Occupational Stress Questionnaire    Feeling of Stress: Not at all  Social Connections: Moderately Integrated (10/18/2023)   Social Connection and Isolation Panel    Frequency of Communication with Friends and Family: More than three times a week    Frequency of Social Gatherings with Friends and Family: Once a week    Attends Religious Services: 1 to 4 times per year    Active Member of [employer] or Organizations: No    Attends Engineer, structural: Not on file    Marital Status: Married  Intimate Partner Violence: Unknown (04/12/2021)   Received from [facility]   HITS    Physically Hurt: Not on file    Insult or Talk Down To: Not on file    Threaten Physical Harm: Not on file    Scream or Curse: Not on file   Social History   Tobacco Use  Smoking Status Former   Types: Cigarettes  Smokeless Tobacco Never   Social History   Substance and Sexual Activity  Alcohol Use Yes   Alcohol/week: 4.0 standard drinks of alcohol   Types: 4 Cans of beer per week    Family History:  Family History  Problem Relation Age of Onset   Cancer Mother    Hypertension Mother    Hypertension Father     Past medical history, surgical history, medications, allergies, family history and social history reviewed with patient today and changes made to appropriate areas of the chart.   Review of Systems  Constitutional: Negative.   HENT: Negative.    Eyes: Negative.   Respiratory: Negative.    Cardiovascular: Negative.   Gastrointestinal: Negative.   Genitourinary: Negative.   Musculoskeletal: Negative.   Skin: Negative.   Neurological:  Negative.   Psychiatric/Behavioral: Negative.     All other ROS negative except what is listed  above and in the HPI.      Objective:    BP 124/86 (BP Location: Left Arm, Cuff Size: Normal)   Pulse 73   Temp (!) 96.9 F (36.1 C) (Temporal)   Ht 5' 7 (1.702 m)   Wt 208 lb 9.6 oz (94.6 kg)   SpO2 97%   BMI 32.67 kg/m   Wt Readings from Last 3 Encounters:  10/22/23 208 lb 9.6 oz (94.6 kg)  12/26/22 203 lb 9.6 oz (92.4 kg)  10/01/22 204 lb 12.8 oz (92.9 kg)    Physical Exam Vitals and nursing note reviewed.  Constitutional:      General: He is not in acute distress.    Appearance: Normal appearance.  HENT:     Head: Normocephalic and atraumatic.     Right Ear: Tympanic membrane, ear canal and external ear normal.     Left Ear: Tympanic membrane, ear canal and external ear normal.     Mouth/Throat:     Mouth: Mucous membranes are moist.     Pharynx: No posterior oropharyngeal erythema.  Eyes:     Conjunctiva/sclera: Conjunctivae normal.  Cardiovascular:     Rate and Rhythm: Normal rate and regular rhythm.     Pulses: Normal pulses.     Heart sounds: Normal heart sounds.  Pulmonary:     Effort: Pulmonary effort is normal.     Breath sounds: Normal breath sounds.  Abdominal:     Palpations: Abdomen is soft.     Tenderness: There is no abdominal tenderness.  Musculoskeletal:        General: Normal range of motion.     Cervical back: Normal range of motion and neck supple. No tenderness.     Right lower leg: No edema.     Left lower leg: No edema.  Lymphadenopathy:     Cervical: No cervical adenopathy.  Skin:    General: Skin is warm and dry.  Neurological:     General: No focal deficit present.     Mental Status: He is alert and oriented to person, place, and time.     Cranial Nerves: No cranial nerve deficit.     Coordination: Coordination normal.     Gait: Gait normal.  Psychiatric:        Mood and Affect: Mood normal.        Behavior: Behavior normal.         Thought Content: Thought content normal.        Judgment: Judgment normal.     Results for orders placed or performed in visit on 10/22/23  CBC with Differential/Platelet   Collection Time: 10/22/23  1:56 PM  Result Value Ref Range   WBC 4.6 4.0 - 10.5 K/uL   RBC 4.63 4.22 - 5.81 Mil/uL   Hemoglobin 13.2 13.0 - 17.0 g/dL   HCT 59.3 60.9 - 47.9 %   MCV 87.7 78.0 - 100.0 fl   MCHC 32.4 30.0 - 36.0 g/dL   RDW 86.6 88.4 - 84.4 %   Platelets 321.0 150.0 - 400.0 K/uL   Neutrophils Relative % 44.9 43.0 - 77.0 %   Lymphocytes Relative 42.7 12.0 - 46.0 %   Monocytes Relative 7.6 3.0 - 12.0 %   Eosinophils Relative 4.4 0.0 - 5.0 %   Basophils Relative 0.4 0.0 - 3.0 %   Neutro Abs 2.1 1.4 - 7.7 K/uL   Lymphs Abs 2.0 0.7 - 4.0 K/uL   Monocytes Absolute 0.4 0.1 - 1.0 K/uL   Eosinophils  Absolute 0.2 0.0 - 0.7 K/uL   Basophils Absolute 0.0 0.0 - 0.1 K/uL  Comprehensive metabolic panel with GFR   Collection Time: 10/22/23  1:56 PM  Result Value Ref Range   Sodium 138 135 - 145 mEq/L   Potassium 3.7 3.5 - 5.1 mEq/L   Chloride 102 96 - 112 mEq/L   CO2 26 19 - 32 mEq/L   Glucose, Bld 102 (H) 70 - 99 mg/dL   BUN 12 6 - 23 mg/dL   Creatinine, Ser 9.19 0.40 - 1.50 mg/dL   Total Bilirubin 0.4 0.2 - 1.2 mg/dL   Alkaline Phosphatase 73 39 - 117 U/L   AST 43 (H) 0 - 37 U/L   ALT 77 (H) 0 - 53 U/L   Total Protein 7.7 6.0 - 8.3 g/dL   Albumin 4.7 3.5 - 5.2 g/dL   GFR 897.79 >39.99 mL/min   Calcium  9.5 8.4 - 10.5 mg/dL  Lipid panel   Collection Time: 10/22/23  1:56 PM  Result Value Ref Range   Cholesterol 154 0 - 200 mg/dL   Triglycerides 22.9 0.0 - 149.0 mg/dL   HDL 51.29 >60.99 mg/dL   VLDL 84.5 0.0 - 59.9 mg/dL   LDL Cholesterol 90 0 - 99 mg/dL   Total CHOL/HDL Ratio 3    NonHDL 105.72   VITAMIN D  25 Hydroxy (Vit-D Deficiency, Fractures)   Collection Time: 10/22/23  1:56 PM  Result Value Ref Range   VITD 31.67 30.00 - 100.00 ng/mL  Hemoglobin A1c   Collection Time: 10/22/23  1:56 PM   Result Value Ref Range   Hgb A1c MFr Bld 6.2 4.6 - 6.5 %      Assessment & Plan:   Problem List Items Addressed This Visit       Cardiovascular and Mediastinum   Primary hypertension   Chronic, stable. Continue nifedipine  60mg  daily. Check CMP, CBC today. Follow-up in 1 year.       Relevant Orders   CBC with Differential/Platelet (Completed)   Comprehensive metabolic panel with GFR (Completed)     Other   Hyperlipidemia   Chronic, stable. Continue rosuvastatin  20mg  daily. Check CMP, CBC, lipid panel and adjust regimen based on results.       Relevant Orders   CBC with Differential/Platelet (Completed)   Comprehensive metabolic panel with GFR (Completed)   Lipid panel (Completed)   Vitamin D  deficiency   Check vitamin D  and treat based on results. Currently taking vitamin D  2,000 units daily.       Relevant Orders   VITAMIN D  25 Hydroxy (Vit-D Deficiency, Fractures) (Completed)   Prediabetes   Chronic, stable. Check A1c today and treat based on results.       Relevant Orders   Hemoglobin A1c (Completed)   Routine general medical examination at a health care facility - Primary   Health maintenance reviewed and updated. Discussed nutrition, exercise. Follow-up 1 year.       Other Visit Diagnoses       Immunization due       Flu vaccine given today   Relevant Orders   Flu vaccine trivalent PF, 6mos and older(Flulaval,Afluria,Fluarix,Fluzone) (Completed)      IMMUNIZATIONS:   - Tdap: Tetanus vaccination status reviewed: last tetanus booster within 10 years. - Influenza: Administered today - Pneumovax: Not applicable - Prevnar: Declined - HPV: Not applicable - Shingrix  vaccine: Up to date  SCREENING: - Colonoscopy: Up to date  Discussed with patient purpose of the colonoscopy is to detect colon  cancer at curable precancerous or early stages   - AAA Screening: Not applicable   PATIENT COUNSELING:    Sexuality: Discussed sexually transmitted diseases,  partner selection, use of condoms, avoidance of unintended pregnancy  and contraceptive alternatives.   Advised to avoid cigarette smoking.  I discussed with the patient that most people either abstain from alcohol or drink within safe limits (<=14/week and <=4 drinks/occasion for males, <=7/weeks and <= 3 drinks/occasion for females) and that the risk for alcohol disorders and other health effects rises proportionally with the number of drinks per week and how often a drinker exceeds daily limits.  Discussed cessation/primary prevention of drug use and availability of treatment for abuse.   Diet: Encouraged to adjust caloric intake to maintain  or achieve ideal body weight, to reduce intake of dietary saturated fat and total fat, to limit sodium intake by avoiding high sodium foods and not adding table salt, and to maintain adequate dietary potassium and calcium  preferably from fresh fruits, vegetables, and low-fat dairy products.    stressed the importance of regular exercise  Injury prevention: Discussed safety belts, safety helmets, smoke detector, smoking near bedding or upholstery.   Dental health: Discussed importance of regular tooth brushing, flossing, and dental visits.   Follow up plan: NEXT PREVENTATIVE PHYSICAL DUE IN 1 YEAR. Return in about 1 year (around 10/21/2024) for CPE.  Jisell Majer A Hawley Michel

## 2023-10-23 ENCOUNTER — Ambulatory Visit: Payer: Self-pay | Admitting: Nurse Practitioner

## 2023-10-23 DIAGNOSIS — R945 Abnormal results of liver function studies: Secondary | ICD-10-CM

## 2023-10-24 NOTE — Assessment & Plan Note (Signed)
 Chronic, stable. Continue nifedipine  60mg  daily. Check CMP, CBC today. Follow-up in 1 year.

## 2023-10-24 NOTE — Assessment & Plan Note (Signed)
Chronic, stable. Check A1c today and treat based on results.

## 2023-10-24 NOTE — Assessment & Plan Note (Signed)
 Health maintenance reviewed and updated. Discussed nutrition, exercise. Follow-up 1 year.

## 2023-10-24 NOTE — Assessment & Plan Note (Signed)
 Chronic, stable. Continue rosuvastatin  20mg  daily. Check CMP, CBC, lipid panel and adjust regimen based on results.

## 2023-10-24 NOTE — Assessment & Plan Note (Signed)
 Check vitamin D  and treat based on results. Currently taking vitamin D  2,000 units daily.

## 2023-10-30 ENCOUNTER — Other Ambulatory Visit

## 2024-01-02 ENCOUNTER — Other Ambulatory Visit: Payer: Self-pay | Admitting: Nurse Practitioner

## 2024-01-04 NOTE — Telephone Encounter (Signed)
 Requesting:  ROSUVASTATIN  CALCIUM  20 MG TAB  Last Visit: 10/22/2023 Next Visit: Visit date not found Last Refill: 12/26/22  Please Advise

## 2024-01-16 ENCOUNTER — Other Ambulatory Visit: Payer: Self-pay | Admitting: Nurse Practitioner

## 2024-01-18 NOTE — Telephone Encounter (Signed)
 Requesting: NIFEDIPINE  ER 60 MG TABLET, 24HR  Last Visit: 10/22/2023 Next Visit: Visit date not found Last Refill: 12/26/2022  Please Advise
